# Patient Record
Sex: Female | Born: 1955 | Race: Black or African American | Hispanic: No | Marital: Married | Smoking: Never smoker
Health system: Southern US, Community
[De-identification: ages and names within clinical notes are randomized; demographics above are authoritative.]

## PROBLEM LIST (undated history)

## (undated) DIAGNOSIS — E119 Type 2 diabetes mellitus without complications: Secondary | ICD-10-CM

## (undated) DIAGNOSIS — K219 Gastro-esophageal reflux disease without esophagitis: Secondary | ICD-10-CM

## (undated) DIAGNOSIS — Z860100 Personal history of colon polyps, unspecified: Secondary | ICD-10-CM

## (undated) DIAGNOSIS — I10 Essential (primary) hypertension: Secondary | ICD-10-CM

## (undated) DIAGNOSIS — R011 Cardiac murmur, unspecified: Secondary | ICD-10-CM

## (undated) DIAGNOSIS — B351 Tinea unguium: Secondary | ICD-10-CM

## (undated) DIAGNOSIS — R079 Chest pain, unspecified: Secondary | ICD-10-CM

## (undated) HISTORY — DX: Tinea unguium: B35.1

## (undated) HISTORY — PX: ABDOMINAL HYSTERECTOMY: SHX81

## (undated) HISTORY — DX: Type 2 diabetes mellitus without complications: E11.9

## (undated) HISTORY — DX: Cardiac murmur, unspecified: R01.1

## (undated) HISTORY — PX: REDUCTION MAMMAPLASTY: SUR839

## (undated) HISTORY — DX: Gastro-esophageal reflux disease without esophagitis: K21.9

## (undated) HISTORY — DX: Personal history of colon polyps, unspecified: Z86.0100

## (undated) HISTORY — DX: Chest pain, unspecified: R07.9

## (undated) HISTORY — PX: OTHER SURGICAL HISTORY: SHX169

## (undated) HISTORY — PX: OOPHORECTOMY: SHX86

## (undated) HISTORY — DX: Personal history of colonic polyps: Z86.010

---

## 1998-12-11 ENCOUNTER — Other Ambulatory Visit: Admission: RE | Admit: 1998-12-11 | Discharge: 1998-12-11 | Payer: Self-pay

## 2004-03-23 ENCOUNTER — Other Ambulatory Visit: Payer: Self-pay

## 2004-03-27 ENCOUNTER — Inpatient Hospital Stay: Payer: Self-pay | Admitting: Unknown Physician Specialty

## 2004-11-19 ENCOUNTER — Ambulatory Visit: Payer: Self-pay | Admitting: Internal Medicine

## 2005-12-16 ENCOUNTER — Ambulatory Visit: Payer: Self-pay | Admitting: Gastroenterology

## 2006-02-13 ENCOUNTER — Ambulatory Visit: Payer: Self-pay | Admitting: Internal Medicine

## 2006-10-07 ENCOUNTER — Ambulatory Visit: Payer: Self-pay | Admitting: Chiropractor

## 2007-10-07 ENCOUNTER — Ambulatory Visit: Payer: Self-pay | Admitting: Internal Medicine

## 2008-10-10 ENCOUNTER — Ambulatory Visit: Payer: Self-pay | Admitting: Internal Medicine

## 2009-11-10 ENCOUNTER — Ambulatory Visit: Payer: Self-pay | Admitting: Internal Medicine

## 2010-12-04 ENCOUNTER — Ambulatory Visit: Payer: Self-pay | Admitting: Internal Medicine

## 2012-01-15 ENCOUNTER — Ambulatory Visit: Payer: Self-pay | Admitting: Internal Medicine

## 2013-06-17 ENCOUNTER — Ambulatory Visit: Payer: Self-pay | Admitting: Internal Medicine

## 2013-06-22 ENCOUNTER — Ambulatory Visit: Payer: Self-pay | Admitting: Internal Medicine

## 2013-12-21 ENCOUNTER — Ambulatory Visit: Payer: Self-pay | Admitting: Internal Medicine

## 2014-07-11 ENCOUNTER — Ambulatory Visit: Payer: Self-pay | Admitting: Internal Medicine

## 2015-05-16 ENCOUNTER — Other Ambulatory Visit: Payer: Self-pay | Admitting: Internal Medicine

## 2015-05-16 DIAGNOSIS — Z1231 Encounter for screening mammogram for malignant neoplasm of breast: Secondary | ICD-10-CM

## 2015-07-13 ENCOUNTER — Ambulatory Visit: Payer: Self-pay

## 2015-09-11 ENCOUNTER — Ambulatory Visit: Payer: Self-pay

## 2015-09-12 ENCOUNTER — Ambulatory Visit
Admission: RE | Admit: 2015-09-12 | Discharge: 2015-09-12 | Disposition: A | Discharge status: Home / Self Care | Payer: Managed Care, Other (non HMO) | Source: Ambulatory Visit | Attending: Internal Medicine | Admitting: Internal Medicine

## 2015-09-12 DIAGNOSIS — Z1231 Encounter for screening mammogram for malignant neoplasm of breast: Secondary | ICD-10-CM | POA: Insufficient documentation

## 2016-07-03 ENCOUNTER — Other Ambulatory Visit: Payer: Self-pay | Admitting: Internal Medicine

## 2016-07-03 DIAGNOSIS — Z1231 Encounter for screening mammogram for malignant neoplasm of breast: Secondary | ICD-10-CM

## 2016-09-16 ENCOUNTER — Ambulatory Visit: Payer: Managed Care, Other (non HMO)

## 2016-09-25 ENCOUNTER — Ambulatory Visit
Admission: RE | Admit: 2016-09-25 | Discharge: 2016-09-25 | Disposition: A | Discharge status: Home / Self Care | Payer: Managed Care, Other (non HMO) | Source: Ambulatory Visit | Attending: Internal Medicine | Admitting: Internal Medicine

## 2016-09-25 ENCOUNTER — Other Ambulatory Visit: Payer: Self-pay | Admitting: Internal Medicine

## 2016-09-25 DIAGNOSIS — Z1231 Encounter for screening mammogram for malignant neoplasm of breast: Secondary | ICD-10-CM

## 2016-12-06 ENCOUNTER — Other Ambulatory Visit: Payer: Self-pay | Admitting: Internal Medicine

## 2016-12-06 DIAGNOSIS — M79605 Pain in left leg: Principal | ICD-10-CM

## 2016-12-06 DIAGNOSIS — M79604 Pain in right leg: Secondary | ICD-10-CM

## 2016-12-11 ENCOUNTER — Ambulatory Visit
Admission: RE | Admit: 2016-12-11 | Discharge: 2016-12-11 | Disposition: A | Discharge status: Home / Self Care | Payer: Managed Care, Other (non HMO) | Source: Ambulatory Visit | Attending: Internal Medicine | Admitting: Internal Medicine

## 2016-12-11 DIAGNOSIS — M79604 Pain in right leg: Secondary | ICD-10-CM

## 2016-12-11 DIAGNOSIS — M79606 Pain in leg, unspecified: Secondary | ICD-10-CM | POA: Insufficient documentation

## 2016-12-11 DIAGNOSIS — M79605 Pain in left leg: Secondary | ICD-10-CM

## 2016-12-11 DIAGNOSIS — E1151 Type 2 diabetes mellitus with diabetic peripheral angiopathy without gangrene: Secondary | ICD-10-CM | POA: Diagnosis not present

## 2017-07-13 ENCOUNTER — Encounter: Payer: Self-pay | Admitting: Emergency Medicine

## 2017-07-13 ENCOUNTER — Emergency Department
Admission: EM | Admit: 2017-07-13 | Discharge: 2017-07-13 | Disposition: A | Discharge status: Home / Self Care | Payer: Managed Care, Other (non HMO) | Attending: Emergency Medicine | Admitting: Emergency Medicine

## 2017-07-13 ENCOUNTER — Emergency Department: Payer: Managed Care, Other (non HMO)

## 2017-07-13 DIAGNOSIS — Z7982 Long term (current) use of aspirin: Secondary | ICD-10-CM | POA: Diagnosis not present

## 2017-07-13 DIAGNOSIS — I10 Essential (primary) hypertension: Secondary | ICD-10-CM | POA: Diagnosis not present

## 2017-07-13 DIAGNOSIS — M549 Dorsalgia, unspecified: Secondary | ICD-10-CM

## 2017-07-13 DIAGNOSIS — M545 Low back pain, unspecified: Secondary | ICD-10-CM

## 2017-07-13 HISTORY — DX: Essential (primary) hypertension: I10

## 2017-07-13 MED ORDER — KETOROLAC TROMETHAMINE 30 MG/ML IJ SOLN
30.0000 mg | Freq: Once | INTRAMUSCULAR | Status: AC
  Administered 2017-07-13: 30 mg via INTRAMUSCULAR
  Filled 2017-07-13: qty 1

## 2017-07-13 MED ORDER — NAPROXEN 500 MG PO TABS: 500.0000 mg | ORAL_TABLET | Freq: Two times a day (BID) | ORAL | 0 refills | Status: DC

## 2017-07-13 MED ORDER — CYCLOBENZAPRINE HCL 10 MG PO TABS: 10.0000 mg | ORAL_TABLET | Freq: Three times a day (TID) | ORAL | 0 refills | Status: DC | PRN

## 2017-07-13 MED ORDER — CYCLOBENZAPRINE HCL 10 MG PO TABS
10.0000 mg | ORAL_TABLET | Freq: Once | ORAL | Status: AC
  Administered 2017-07-13: 10 mg via ORAL
  Filled 2017-07-13: qty 1

## 2017-07-13 NOTE — ED Notes (Signed)
See triage note  Presents with mid to lower back pain which started on Friday  Denies any known injury  Pain is mainly on the right but is also having some numbness to left leg

## 2017-07-13 NOTE — ED Triage Notes (Signed)
Pt c/o back pain to mid back. Pt reports pain is mostly right side but is on the left side as well. Pt reports was seen by her chiropractor on Friday due to the pain but it did not help. Pt denies activities or recently injuries that would have caused the pain. Pt denies urinary symptoms.

## 2017-07-13 NOTE — ED Provider Notes (Signed)
[facility] Emergency Department Provider Note ____________________________________________  Time seen: Approximately 10:31 AM  I have reviewed the triage vital signs and the nursing notes.   HISTORY  Chief Complaint Back Pain    HPI Elspeth Chovelyn D Waage is a 62 y.o. female who presents to the emergency department for evaluation and treatment of back pain that started on Friday. She noticed it upon awakening. No relief with NSAIDs or a chiropractor adjustment, which typically helps when she has back pain. Pain worsens when lifting the left leg to her pants or with rotation at the waist on the right side. She states that outside of mopping on Thursday she has not done any strenuous activity. She did say that while mopping, the mop broke and she may have twisted but didn't realize it.  Past Medical History:  Diagnosis Date  . Hypertension     There are no active problems to display for this patient.   Past Surgical History:  Procedure Laterality Date  . REDUCTION MAMMAPLASTY Bilateral     Prior to Admission medications   Medication Sig Start Date End Date Taking? Authorizing Provider  amLODipine (NORVASC) 10 MG tablet Take 10 mg by mouth daily.   Yes [provider]  aspirin 81 MG chewable tablet Chew 81 mg by mouth daily.   Yes [provider]  levothyroxine (SYNTHROID, LEVOTHROID) 50 MCG tablet Take 50 mcg by mouth daily before breakfast.   Yes [provider]  lisinopril-hydrochlorothiazide (PRINZIDE,ZESTORETIC) 20-25 MG tablet Take 1 tablet by mouth daily.   Yes [provider]  metFORMIN (GLUCOPHAGE) 500 MG tablet Take 500 mg by mouth 2 (two) times daily with a meal.   Yes [provider]  cyclobenzaprine (FLEXERIL) 10 MG tablet Take 1 tablet (10 mg total) by mouth 3 (three) times daily as needed for muscle spasms. 07/13/17   Kayln Garceau, Rulon Eisenmengerari B, FNP  naproxen (NAPROSYN) 500 MG tablet Take 1 tablet (500 mg total)  by mouth 2 (two) times daily with a meal. 07/13/17   Evelise Reine B, FNP    Allergies Patient has no known allergies.  No family history on file.  Social History Social History   Tobacco Use  . Smoking status: Not on file  Substance Use Topics  . Alcohol use: No    Frequency: Never  . Drug use: No    Review of Systems Constitutional: Negative for recent illness. Cardiovascular: Negative for chest pain Respiratory: Negative for shortness of breath Musculoskeletal: Positive for mid and lower back pain Skin: Negative for rash, lesion, or wound. ____________________________________________   PHYSICAL EXAM:  VITAL SIGNS: ED Triage Vitals  Enc Vitals Group     BP 07/13/17 0805 (!) 165/77     Pulse Rate 07/13/17 0805 68     Resp 07/13/17 0805 20     Temp 07/13/17 0805 98.2 F (36.8 C)     Temp Source 07/13/17 0805 Oral     SpO2 07/13/17 0805 97 %     Weight 07/13/17 0806 178 lb (80.7 kg)     Height 07/13/17 0806 5\' 6"  (1.676 m)     Head Circumference --      Peak Flow --      Pain Score 07/13/17 0806 7     Pain Loc --      Pain Edu? --      Excl. in GC? --     Constitutional: Alert and oriented. Well appearing and in no acute distress. Eyes: Conjunctivae are clear  without discharge or drainage Head: Atraumatic Neck: Supple Respiratory: Respirations even and unlabored. Musculoskeletal: Pain in the mid and lower back is induced with movement.  Straight leg raise is negative bilaterally.  Pain of the mid and lower back is diffuse but greater on the right side.  Pain in the left lower back is induced with flexion of the left hip. Neurologic: No radiculopathy Skin: Intact Psychiatric: Affect and behavior is appropriate  ____________________________________________   LABS (all labs ordered are listed, but only abnormal results are displayed)  Labs Reviewed - No data to display ____________________________________________  RADIOLOGY  Images of the thoracic and  lumbar spine indicated no acute bony abnormality per radiology.  There is some degenerative disc changes on both the thoracic and lumbar spine. ____________________________________________   PROCEDURES  Procedures  ____________________________________________   INITIAL IMPRESSION / ASSESSMENT AND PLAN / ED COURSE  EVERLEAN BUCHER is a 62 y.o. female who presents to the emergency department for treatment and evaluation of mid and lower back pain that was not relieved by over-the-counter medications.  The patient states that she had taken ibuprofen, however reports that the tablet was "325 mg."  This is most likely acetaminophen or aspirin and not ibuprofen.  She will be given an injection of Toradol and a Flexeril while here in the emergency department.  She will be given a prescription of Naprosyn and Flexeril upon discharge.  She was instructed to follow-up with her primary care provider if not improving over a week.  She was advised to return to the emergency department for symptoms of change or worsen if she is unable to schedule an appointment.  Medications  ketorolac (TORADOL) 30 MG/ML injection 30 mg (30 mg Intramuscular Given 07/13/17 1057)  cyclobenzaprine (FLEXERIL) tablet 10 mg (10 mg Oral Given 07/13/17 1057)    Pertinent labs & imaging results that were available during my care of the patient were reviewed by me and considered in my medical decision making (see chart for details).  _________________________________________   FINAL CLINICAL IMPRESSION(S) / ED DIAGNOSES  Final diagnoses:  Acute lumbar back pain  Musculoskeletal back pain    ED Discharge Orders        Ordered    cyclobenzaprine (FLEXERIL) 10 MG tablet  3 times daily PRN     07/13/17 1111    naproxen (NAPROSYN) 500 MG tablet  2 times daily with meals     07/13/17 1111       If controlled substance prescribed during this visit, 12 month history viewed on the [facility] prior to issuing an initial  prescription for Schedule II or III opiod.    Chinita Pester, FNP 07/13/17 1334    Governor Rooks, MD 07/13/17 [phone]

## 2017-07-25 ENCOUNTER — Other Ambulatory Visit: Payer: Self-pay | Admitting: Internal Medicine

## 2017-07-25 DIAGNOSIS — Z1231 Encounter for screening mammogram for malignant neoplasm of breast: Secondary | ICD-10-CM

## 2017-08-16 ENCOUNTER — Encounter: Payer: Self-pay | Admitting: Emergency Medicine

## 2017-08-16 ENCOUNTER — Other Ambulatory Visit: Payer: Self-pay

## 2017-08-16 DIAGNOSIS — G8929 Other chronic pain: Secondary | ICD-10-CM | POA: Insufficient documentation

## 2017-08-16 DIAGNOSIS — Z79899 Other long term (current) drug therapy: Secondary | ICD-10-CM | POA: Diagnosis not present

## 2017-08-16 DIAGNOSIS — M545 Low back pain: Secondary | ICD-10-CM | POA: Diagnosis not present

## 2017-08-16 DIAGNOSIS — I10 Essential (primary) hypertension: Secondary | ICD-10-CM | POA: Insufficient documentation

## 2017-08-16 NOTE — ED Triage Notes (Signed)
Pt arrives ambulatory to triage with c/o right lower back pain which she states that was occurring all day and was increased after a long car ride. Pt states that she has had issues with this in the past. Pt is in NAD.

## 2017-08-17 ENCOUNTER — Emergency Department
Admission: EM | Admit: 2017-08-17 | Discharge: 2017-08-17 | Disposition: A | Discharge status: Home / Self Care | Payer: Managed Care, Other (non HMO) | Attending: Emergency Medicine | Admitting: Emergency Medicine

## 2017-08-17 DIAGNOSIS — G8929 Other chronic pain: Secondary | ICD-10-CM

## 2017-08-17 DIAGNOSIS — M545 Low back pain: Secondary | ICD-10-CM

## 2017-08-17 MED ORDER — HYDROCODONE-ACETAMINOPHEN 5-325 MG PO TABS: 1.0000 | ORAL_TABLET | Freq: Four times a day (QID) | ORAL | 0 refills | Status: DC | PRN

## 2017-08-17 MED ORDER — IBUPROFEN 600 MG PO TABS
600.0000 mg | ORAL_TABLET | Freq: Once | ORAL | Status: AC
  Administered 2017-08-17: 600 mg via ORAL
  Filled 2017-08-17: qty 1

## 2017-08-17 MED ORDER — PREDNISONE 50 MG PO TABS: 50.0000 mg | ORAL_TABLET | Freq: Every day | ORAL | 0 refills | Status: AC

## 2017-08-17 MED ORDER — PREDNISONE 20 MG PO TABS
60.0000 mg | ORAL_TABLET | Freq: Once | ORAL | Status: AC
  Administered 2017-08-17: 60 mg via ORAL
  Filled 2017-08-17: qty 3

## 2017-08-17 MED ORDER — HYDROCODONE-ACETAMINOPHEN 5-325 MG PO TABS
1.0000 | ORAL_TABLET | Freq: Once | ORAL | Status: AC
  Administered 2017-08-17: 1 via ORAL
  Filled 2017-08-17: qty 1

## 2017-08-17 NOTE — ED Notes (Signed)
Pt reports she rode in a car today for about 2 hours. Developed right side mid to lower back pain after that. Reports since being her the pain now radiates to her left lower back. Pt took a muscle relaxer at home earlier this evening with no relief. Pt describes the pain as tingling and rates at 5/10 while still and 9/10 with movement. CMS intact. Denies injury.

## 2017-08-17 NOTE — ED Provider Notes (Signed)
[facility] Emergency Department Provider Note  ____________________________________________   First MD Initiated Contact with Patient 08/17/17 [phone]     (approximate)  I have reviewed the triage vital signs and the nursing notes.   HISTORY  Chief Complaint Back Pain   HPI Morgan Ochoa is a 62 y.o. female who comes to the emergency department with gradual onset not maximal onset moderate severity throbbing right low back pain radiating down her right buttock towards the lateral aspect of her right leg.  She has a long-standing history of chronic low back pain.  She took a 2-hour car ride today which she feels exacerbated her symptoms.  Her symptoms are slightly worse when walking and improved with rest.  She denies trauma.  She denies fevers or chills.  She denies history of TB and IV drug use or steroids.  She denies fecal or urinary hesitance or incontinence.  Past Medical History:  Diagnosis Date  . Hypertension     There are no active problems to display for this patient.   Past Surgical History:  Procedure Laterality Date  . REDUCTION MAMMAPLASTY Bilateral     Prior to Admission medications   Medication Sig Start Date End Date Taking? Authorizing Provider  amLODipine (NORVASC) 10 MG tablet Take 10 mg by mouth daily.    [provider]  aspirin 81 MG chewable tablet Chew 81 mg by mouth daily.    [provider]  cyclobenzaprine (FLEXERIL) 10 MG tablet Take 1 tablet (10 mg total) by mouth 3 (three) times daily as needed for muscle spasms. 07/13/17   Triplett, Rulon Eisenmengerari B, FNP  HYDROcodone-acetaminophen (NORCO) 5-325 MG tablet Take 1 tablet by mouth every 6 (six) hours as needed for up to 7 doses for severe pain. 08/17/17   Merrily Brittleifenbark, Caliph [place]ow[place]ak, MD  levothyroxine (SYNTHROID, LEVOTHROID) 50 MCG tablet Take 50 mcg by mouth daily before breakfast.    [provider]  lisinopril-hydrochlorothiazide (PRINZIDE,ZESTORETIC) 20-25 MG tablet  Take 1 tablet by mouth daily.    [provider]  metFORMIN (GLUCOPHAGE) 500 MG tablet Take 500 mg by mouth 2 (two) times daily with a meal.    [provider]  naproxen (NAPROSYN) 500 MG tablet Take 1 tablet (500 mg total) by mouth 2 (two) times daily with a meal. 07/13/17   Triplett, Cari B, FNP  predniSONE (DELTASONE) 50 MG tablet Take 1 tablet (50 mg total) by mouth daily for 4 days. 08/17/17 08/21/17  Merrily Brittleifenbark, Jaydon Soroka, MD    Allergies Patient has no known allergies.  No family history on file.  Social History Social History   Tobacco Use  . Smoking status: Never Smoker  . Smokeless tobacco: Never Used  Substance Use Topics  . Alcohol use: No    Frequency: Never  . Drug use: No    Review of Systems Constitutional: No fever/chills ENT: No sore throat. Cardiovascular: Denies chest pain. Respiratory: Denies shortness of breath. Gastrointestinal: No abdominal pain.  No nausea, no vomiting.  No diarrhea.  No constipation. Musculoskeletal: Positive for back pain. Neurological: Negative for headaches   ____________________________________________   PHYSICAL EXAM:  VITAL SIGNS: ED Triage Vitals [08/16/17 2218]  Enc Vitals Group     BP (!) 167/78     Pulse Rate 71     Resp 18     Temp 98.7 F (37.1 C)     Temp Source Oral     SpO2 100 %     Weight 175 lb (79.4 kg)  Height 5\' 7"  (1.702 m)     Head Circumference      Peak Flow      Pain Score 8     Pain Loc      Pain Edu?      Excl. in GC?     Constitutional: Alert and oriented x4 appears somewhat uncomfortable nontoxic no diaphoresis speaks full clear sentences Head: Atraumatic. Nose: No congestion/rhinnorhea. Mouth/Throat: No trismus Neck: No stridor.   Cardiovascular: Regular rate and rhythm Respiratory: Normal respiratory effort.  No retractions. MSK: No midline tenderness she is tender right paraspinal spasm Neurologic:  Normal speech and language. No gross focal neurologic deficits are  appreciated.  Skin:  Skin is warm, dry and intact. No rash noted.    ____________________________________________  LABS (all labs ordered are listed, but only abnormal results are displayed)  Labs Reviewed - No data to display   __________________________________________  EKG   ____________________________________________  RADIOLOGY   ____________________________________________   DIFFERENTIAL includes but not limited to  Musculoskeletal low back pain, cauda equina, epidural abscess   PROCEDURES  Procedure(s) performed: no  Procedures  Critical Care performed: no  Observation: no ____________________________________________   INITIAL IMPRESSION / ASSESSMENT AND PLAN / ED COURSE  Pertinent labs & imaging results that were available during my care of the patient were reviewed by me and considered in my medical decision making (see chart for details).      ----------------------------------------- 3:44 AM on 08/17/2017 -----------------------------------------  The patient arrives stiff and uncomfortable appearing although neuro intact.  She has no new trauma.  She had recent imaging of her back.  She says the only thing tends to work for her back pain is prednisone.  Given a dose of prednisone and Norco now and her pain is improved.  She will be discharged home with primary care follow-up and strict return precautions.  She verbalizes understanding and agreement the plan. ____________________________________________   FINAL CLINICAL IMPRESSION(S) / ED DIAGNOSES  Final diagnoses:  Chronic right-sided low back pain without sciatica      NEW MEDICATIONS STARTED DURING THIS VISIT:  Discharge Medication List as of 08/17/2017  3:44 AM    START taking these medications   Details  HYDROcodone-acetaminophen (NORCO) 5-325 MG tablet Take 1 tablet by mouth every 6 (six) hours as needed for up to 7 doses for severe pain., Starting Sun 08/17/2017, Print      predniSONE (DELTASONE) 50 MG tablet Take 1 tablet (50 mg total) by mouth daily for 4 days., Starting Sun 08/17/2017, Until Thu 08/21/2017, Print         Note:  This document was prepared using Dragon voice recognition software and may include unintentional dictation errors.      Merrily Brittle, MD 08/20/17 [phone]

## 2017-08-17 NOTE — Discharge Instructions (Signed)
Please take your pain medication as needed for severe symptoms and follow-up with your primary care physician for reevaluation.  Return to the emergency department sooner for any new or worsening symptoms such as fevers, chills, worsening pain, or for any other issues whatsoever.  It was a pleasure to take care of you today, and thank you for coming to our emergency department.  If you have any questions or concerns before leaving please ask the nurse to grab me and I'm more than happy to go through your aftercare instructions again.  If you were prescribed any opioid pain medication today such as Norco, Vicodin, Percocet, morphine, hydrocodone, or oxycodone please make sure you do not drive when you are taking this medication as it can alter your ability to drive safely.  If you have any concerns once you are home that you are not improving or are in fact getting worse before you can make it to your follow-up appointment, please do not hesitate to call [phone] and come back for further evaluation.  Merrily BrittleNeil Nadezhda Pollitt, MD

## 2017-08-26 ENCOUNTER — Ambulatory Visit: Payer: Managed Care, Other (non HMO) | Attending: Internal Medicine

## 2017-08-26 ENCOUNTER — Other Ambulatory Visit: Payer: Self-pay

## 2017-08-26 DIAGNOSIS — M546 Pain in thoracic spine: Secondary | ICD-10-CM | POA: Diagnosis present

## 2017-08-26 DIAGNOSIS — M545 Low back pain: Secondary | ICD-10-CM | POA: Insufficient documentation

## 2017-08-26 NOTE — Patient Instructions (Signed)
(  Home) Extension: Thoracic With Lumbar Lock - Sitting    Sit with back against chair, knees bent, hands folded across (not shown). Extend trunk over chair back. Hold position for __5__ seconds. Repeat ___10  times per set. Do _3___ sets per session daily.   Copyright  VHI. All rights reserved.       Sitting on a chair    Press your hands on your thighs to feel your abdominal muscles contract.   Hold for 5 seconds comfortably.   Repeat 10 times.   Perform 3 sets daily.

## 2017-08-26 NOTE — Therapy (Signed)
[facility] [address] [place], [place], [address] Phone: [phone]   Fax:  [phone]  Physical Therapy Evaluation  Patient Details  Name: Morgan Ochoa MRN: [id number] Date of Birth: 09/12/1955 Referring Provider: Yves DillNeelam Khan, MD   Encounter Date: 08/26/2017  PT End of Session - 08/26/17 1314    Visit Number  1    Number of Visits  11    Date for PT Re-Evaluation  10/02/17    PT Start Time  1315    PT Stop Time  1422    PT Time Calculation (min)  67 min    Activity Tolerance  Patient tolerated treatment well    Behavior During Therapy  [employer] for tasks assessed/performed       Past Medical History:  Diagnosis Date  . Cardiac murmur    from pt medical record  . Chest pain    from pt medical record  . DM2 (diabetes mellitus, type 2) (HCC)    from pt medical record  . GERD (gastroesophageal reflux disease)    from pt medical record  . History of colon polyps    from pt medical record  . Hypertension   . Onychomycosis    from pt medical record    Past Surgical History:  Procedure Laterality Date  . cholonoscopy  2007, 2015   from pt medical record  . REDUCTION MAMMAPLASTY Bilateral     There were no vitals filed for this visit.   Subjective Assessment - 08/26/17 1321    Subjective  R thoracic and low back: 0/10 currently (pt sitting with her R leg crossed over her L), 10/10 at most for the past 2 weeks. Does not know what brought her pain to 10/10.     Pertinent History  Back pain. Gradual onset about 4 weeks ago. 4 weeks ago, pain began on her R side from her shoulder blade to her low back. Also felt pain in her L shoulder, L lateral neck and lateral arm last weekend.  Denies loss of bowel or bladder control, or saddle anesthesia. Feels tingling L lateral thigh. Back pain arrived, was very very intense, lasted for 5 days, then gradually eased off, then she currently has no back pain.   Pt states  that her MD sent her to PT to strengthen her back. L arm is also getting better. 4 weeks ago, pt also went to a chiropractic adjustment and pt feels like the treatment might have aggravated her pain. Heat did not help. Back started feeling a slight ache around May 2018 when pt caught her husband forward, then pulled him back and laid him to the floor when he passed out. Back bothered her to the point where she had pain going up and down steps (anterior thigh and neck pain until October 2018).       Patient Stated Goals  Pt expresses desire to get better.    Currently in Pain?  No/denies    Pain Score  0-No pain    Pain Location  Back    Pain Orientation  Right    Pain Descriptors / Indicators  Aching difficult to tell per pt.     Pain Type  Acute pain    Pain Onset  1 to 4 weeks ago    Pain Frequency  Occasional    Aggravating Factors   Does not know what bothers her back. Back pain just happens. Sometimes she just walks into her  house and her ache increases. L shoulder: reaching over, raising her L arm up, supination.     Pain Relieving Factors  muscle relaxers (not currently on muscle relaxers).          [facility] PT Assessment - 08/26/17 1331      Assessment   Medical Diagnosis  Back pain    Referring Provider  Yves Dill, MD    Onset Date/Surgical Date  08/21/17 Date PT referral signed. Back ache started May 2018    Prior Therapy  No known PT for current condition.       Precautions   Precaution Comments  No known precautions      Restrictions   Other Position/Activity Restrictions  No kown weight bearing restrictions      Observation/Other Assessments   Observations  (-) repeated flexion test (slight R trunk lateral lean when performing movement), (-) Long sit test.       Posture/Postural Control   Posture Comments  Protracted neck, bilaterally protracted shoulders, R shoulder lower, thoracic flexion, slight R trunk rotation. R lateral lean. movement crease around L3/4 area.  Convex R lower thoracic spine.       AROM   Lumbar Flexion  Full, no pain slight R lateral lean with trunk flexion    Lumbar Extension  WFL, no pain    Lumbar - Right Side [facility] with R low back dicomfort (similar area to 4 weeks ago)     Lumbar - Left Side [facility] with L low back pain    Lumbar - Right Rotation  WFL with slight R thoracolumbar symptoms  Sitting    Lumbar - Left Rotation  WFL with slight L lateral trunk symptoms.  Sitting      Strength   Right Hip Flexion  4+/5    Right Hip Extension  4-/5    Right Hip ABduction  4/5    Left Hip Flexion  4+/5    Left Hip Extension  4-/5    Left Hip ABduction  4+/5    Right Knee Flexion  4/5    Right Knee Extension  5/5    Left Knee Flexion  4+/5    Left Knee Extension  5/5      Palpation   Palpation comment  Increased R thoracolumbar paraspinal muscle tension with slight tenderness (same area as her pain 4 weeks ago).  No TTP with R and L UPA to lumbar and lower thoracic transverse process. Decreased Centeral P to A to lumbar spine. Increased stiffness with central P to A throughout thoracic spine.       Ambulation/Gait   Gait Comments  Decrease trunk rotation, L pelvic drop during R LE stance phase > R pelvic drop during L LE stance phase.                Yves Dill, MD 08/21/17   Objective measurements completed on examination: See above findings.   Blood pressure controlled per pt.    4 weeks ago, pt also went to a chiropractic adjustment and pt feels like the treatment might have aggravated her pain. Heat did not help.   Back started feeling a slight ache around May 2018 when pt caught her husband forward, then pulled him back and laid him to the floor when he passed out. Back bothered her to the point where she had pain going up and down steps (anterior thigh and neck pain until October 2018).   Therapeutic exercise  Standing low  rows resisting yellow band 10x5 seconds (easy)   Supine bilateral shoulder  flexion to promote thoracic extension 10x5 seconds   Then with thoracic towel roll 3x5 seconds   Seated thoracic extension over chair 10x5 seconds  Seated bilateral shoulder extension isometrics, hands on thighs 10x5 seconds.   Reviewed HEP. Pt demonstrated and verbalized understanding.   Improved exercise technique, movement at target joints, use of target muscles after mod verbal, visual, tactile cues.     Standing gentle trunk lean when getting purse from mat table.  reproduced R lateral posterior trunk symptoms per pt.     Patient is a 62 year old female who came to physical therapy secondary to R thoracic and low back pain. She also presents with altered posture, decreased lumbar and thoracic mobility, slight reproduction of symptoms with lumbar side bending and rotation, glute weakness R > L, and increased R paraspinal muscle tension at the lumbar and thoracic area. Patient will benefit from skilled physical therapy services to address the aforementioned deficits.           PT Education - 08/26/17 1907    Education provided  Yes    Education Details  ther-ex, HEP, plan of care    Person(s) Educated  Patient    Methods  Explanation;Demonstration;Tactile cues;Verbal cues;Handout    Comprehension  Returned demonstration;Verbalized understanding          PT Long Term Goals - 08/26/17 1835      PT LONG TERM GOAL #1   Title  Patient will have a decrease in back pain to 4/10 or less at worst to promote ability to perform functional tasks.     Baseline  10/10 back pain at most for the past 2 weeks (08/26/2017)    Time  5    Period  Weeks    Status  New    Target Date  10/02/17      PT LONG TERM GOAL #2   Title  Patient will improve bilateral glute med and max strength by at least 1/2 MMT grade to promote ability to perform functional tasks with minimal to no back pain.     Time  5    Period  Weeks    Status  New    Target Date  10/02/17             Plan -  08/26/17 1829    Clinical Impression Statement  Patient is a 62 year old female who came to physical therapy secondary to R thoracic and low back pain. She also presents with altered posture, decreased lumbar and thoracic mobility, slight reproduction of symptoms with lumbar side bending and rotation, glute weakness R > L, and increased R paraspinal muscle tension at the lumbar and thoracic area. Patient will benefit from skilled physical therapy services to address the aforementioned deficits.     History and Personal Factors relevant to plan of care:  Chronicity of condition, thoracic stiffness    Clinical Presentation  Stable    Clinical Presentation due to:  pain has improved based on pt subjective reports    Clinical Decision Making  Low    Rehab Potential  Good    Clinical Impairments Affecting Rehab Potential  Chronicity of condition, thoracic stiffness    PT Frequency  2x / week    PT Duration  Other (comment) 5 weeks    PT Treatment/Interventions  Therapeutic exercise;Therapeutic activities;Manual techniques;Aquatic Therapy;Electrical Stimulation;Iontophoresis 4mg /ml Dexamethasone;Traction;Neuromuscular re-education;Patient/family education;Dry needling traction if appropriate  PT Next Visit Plan  core strengthening, thoracic mobility, hip and scapular strengthening, manual techniques, modalities PRN    Consulted and Agree with Plan of Care  Patient       Patient will benefit from skilled therapeutic intervention in order to improve the following deficits and impairments:  Postural dysfunction, Pain, Decreased strength, Hypomobility  Visit Diagnosis: Right low back pain, unspecified chronicity, with sciatica presence unspecified - Plan: PT plan of care cert/re-cert  Pain in thoracic spine - Plan: PT plan of care cert/re-cert     Problem List There are no active problems to display for this patient.   Loralyn Freshwater PT, DPT   08/26/2017, 7:10 PM  [place] [facility]  [facility] PHYSICAL AND SPORTS MEDICINE [address] [place], [address] Phone: [phone]   Fax:  [phone]  Name: AVALINA BENKO MRN: [id number] Date of Birth: 02/16/1956

## 2017-09-01 ENCOUNTER — Ambulatory Visit: Payer: Managed Care, Other (non HMO)

## 2017-09-01 DIAGNOSIS — M545 Low back pain: Secondary | ICD-10-CM

## 2017-09-01 DIAGNOSIS — M546 Pain in thoracic spine: Secondary | ICD-10-CM

## 2017-09-01 NOTE — Therapy (Signed)
[facility]INE [address], [place], [address] Phone: [phone]   Fax:  [phone]  Physical Therapy Treatment  Patient Details  Name: Morgan Ochoa MRN: [id number] Date of Birth: 03/07/1956 Referring Provider: Yves Dill, MD   Encounter Date: 09/01/2017  PT End of Session - 09/01/17 1433    Visit Number  2    Number of Visits  11    Date for PT Re-Evaluation  10/02/17    PT Start Time  1434    PT Stop Time  1514    PT Time Calculation (min)  40 min    Activity Tolerance  Patient tolerated treatment well    Behavior During Therapy  [facility] for tasks assessed/performed       Past Medical History:  Diagnosis Date  . Cardiac murmur    from pt medical record  . Chest pain    from pt medical record  . DM2 (diabetes mellitus, type 2) (HCC)    from pt medical record  . GERD (gastroesophageal reflux disease)    from pt medical record  . History of colon polyps    from pt medical record  . Hypertension   . Onychomycosis    from pt medical record    Past Surgical History:  Procedure Laterality Date  . cholonoscopy  2007, 2015   from pt medical record  . REDUCTION MAMMAPLASTY Bilateral     There were no vitals filed for this visit.  Subjective Assessment - 09/01/17 1434    Subjective  Back is fine. No pain. The HEP is doing ok. Has been doing them.     Pertinent History  Back pain. Gradual onset about 4 weeks ago. 4 weeks ago, pain began on her R side from her shoulder blade to her low back. Also felt pain in her L shoulder, L lateral neck and lateral arm last weekend.  Denies loss of bowel or bladder control, or saddle anesthesia. Feels tingling L lateral thigh. Back pain arrived, was very very intense, lasted for 5 days, then gradually eased off, then she currently has no back pain.   Pt states that her MD sent her to PT to strengthen her back. L arm is also getting better. 4 weeks ago, pt also went  to a chiropractic adjustment and pt feels like the treatment might have aggravated her pain. Heat did not help. Back started feeling a slight ache around May 2018 when pt caught her husband forward, then pulled him back and laid him to the floor when he passed out. Back bothered her to the point where she had pain going up and down steps (anterior thigh and neck pain until October 2018).       Patient Stated Goals  Pt expresses desire to get better.    Currently in Pain?  No/denies    Pain Score  0-No pain    Pain Onset  1 to 4 weeks ago                               PT Education - 09/01/17 1438    Education provided  Yes    Education Details  ther-ex    [employer]) Educated  Patient    Methods  Explanation;Demonstration;Tactile cues;Verbal cues    Comprehension  Returned demonstration;Verbalized understanding          Objetives   No latex band allergies.  Therapeutic exercise  Standing low rows resisting red band 10x5 seconds  Then with green band 10x5 seconds  Omega rows plate 20 for 16X 5 seconds   paloff press red band 10x 5 seconds each side for 2 sets to promote trunk muscle strengthening.    standing hip extension resisting yellow band with bilateral UE assist 10x each LE to promote glute strengthening  Then red band 10x2 each LE  Standing LE leg press resisting blue band with bilateral UE assist 10x3 each LE  Seated with upright posture: manual perturbation from PT 1 minute x 3 to promote trunk strengthening  Side stepping resisting yellow band around ankles 32 ft to the R and 32 ft to the LE to promote glute med muscle strengthenig.   Standing straight pallof press resisting green band  10x5 seconds  for 2 sets    Improved exercise technique, movement at target joints, use of target muscles after mod verbal, visual, tactile cues.   Decreased R lumbar paraspinal muscle tension felt with exercises promoting activation of trunk  muscles such as low rows. Continued working on trunk and hip strengthening to promote ability to perform standing tasks without back pain. Pt tolerated session well without aggravation of symptoms.         PT Long Term Goals - 08/26/17 1835      PT LONG TERM GOAL #1   Title  Patient will have a decrease in back pain to 4/10 or less at worst to promote ability to perform functional tasks.     Baseline  10/10 back pain at most for the past 2 weeks (08/26/2017)    Time  5    Period  Weeks    Status  New    Target Date  10/02/17      PT LONG TERM GOAL #2   Title  Patient will improve bilateral glute med and max strength by at least 1/2 MMT grade to promote ability to perform functional tasks with minimal to no back pain.     Time  5    Period  Weeks    Status  New    Target Date  10/02/17            Plan - 09/01/17 1439    Clinical Impression Statement  Decreased R lumbar paraspinal muscle tension felt with exercises promoting activation of trunk muscles such as low rows. Continued working on trunk and hip strengthening to promote ability to perform standing tasks without back pain. Pt tolerated session well without aggravation of symptoms.     Rehab Potential  Good    Clinical Impairments Affecting Rehab Potential  Chronicity of condition, thoracic stiffness    PT Frequency  2x / week    PT Duration  Other (comment) 5 weeks    PT Treatment/Interventions  Therapeutic exercise;Therapeutic activities;Manual techniques;Aquatic Therapy;Electrical Stimulation;Iontophoresis 4mg /ml Dexamethasone;Traction;Neuromuscular re-education;Patient/family education;Dry needling traction if appropriate    PT Next Visit Plan  core strengthening, thoracic mobility, hip and scapular strengthening, manual techniques, modalities PRN    Consulted and Agree with Plan of Care  Patient       Patient will benefit from skilled therapeutic intervention in order to improve the following deficits and  impairments:  Postural dysfunction, Pain, Decreased strength, Hypomobility  Visit Diagnosis: Pain in thoracic spine  Right low back pain, unspecified chronicity, with sciatica presence unspecified     Problem List There are no active problems to display for this patient.   Loralyn Freshwater PT, DPT  09/01/2017, 6:05 PM  [facility] [address] [place], [place], [address] Phone: [phone]   Fax:  [phone]  Name: Morgan Ochoa MRN: [id number] Date of Birth: 07/25/1955

## 2017-09-03 ENCOUNTER — Ambulatory Visit: Payer: Managed Care, Other (non HMO)

## 2017-09-03 DIAGNOSIS — M545 Low back pain: Secondary | ICD-10-CM | POA: Diagnosis not present

## 2017-09-03 DIAGNOSIS — M546 Pain in thoracic spine: Secondary | ICD-10-CM

## 2017-09-03 NOTE — Therapy (Signed)
[place] [facility] PH[facility]ORTS MEDICINE [address], [place], [address] Phone: [phone]   Fax:  [phone]  Physical Therapy Treatment  Patient Details  Name: Morgan Ochoa MRN: [id number] Date of Birth: [birth year] Referring Provider: Yves Dill, MD   Encounter Date: 09/03/2017  PT End of Session - 09/03/17 1429    Visit Number  3    Number of Visits  11    Date for PT Re-Evaluation  10/02/17    PT Start Time  1429    PT Stop Time  1518    PT Time Calculation (min)  49 min    Activity Tolerance  Patient tolerated treatment well    Behavior During Therapy  [facility] for tasks assessed/performed       Past Medical History:  Diagnosis Date  . Cardiac murmur    from pt medical record  . Chest pain    from pt medical record  . DM2 (diabetes mellitus, type 2) (HCC)    from pt medical record  . GERD (gastroesophageal reflux disease)    from pt medical record  . History of colon polyps    from pt medical record  . Hypertension   . Onychomycosis    from pt medical record    Past Surgical History:  Procedure Laterality Date  . cholonoscopy  2007, 2015   from pt medical record  . REDUCTION MAMMAPLASTY Bilateral     There were no vitals filed for this visit.  Subjective Assessment - 09/03/17 1431    Subjective  Back is doing ok. No pain or discomfort for the past few days.     Pertinent History  Back pain. Gradual onset about 4 weeks ago. 4 weeks ago, pain began on her R side from her shoulder blade to her low back. Also felt pain in her L shoulder, L lateral neck and lateral arm last weekend.  Denies loss of bowel or bladder control, or saddle anesthesia. Feels tingling L lateral thigh. Back pain arrived, was very very intense, lasted for 5 days, then gradually eased off, then she currently has no back pain.   Pt states that her MD sent her to PT to strengthen her back. L arm is also getting better. 4 weeks ago, pt also went to  a chiropractic adjustment and pt feels like the treatment might have aggravated her pain. Heat did not help. Back started feeling a slight ache around May 2018 when pt caught her husband forward, then pulled him back and laid him to the floor when he passed out. Back bothered her to the point where she had pain going up and down steps (anterior thigh and neck pain until October 2018).       Patient Stated Goals  Pt expresses desire to get better.    Currently in Pain?  No/denies    Pain Score  0-No pain    Pain Onset  1 to 4 weeks ago                               PT Education - 09/03/17 1515    Education provided  Yes    Education Details  ther-ex    [employer]) Educated  Patient    Methods  Explanation;Demonstration;Tactile cues;Verbal cues    Comprehension  Returned demonstration;Verbalized understanding         Objetives  No latex band allergies.   Per ER  note on 07/13/2017: "Pain worsens when lifting the left leg to her pants or with rotation at the waist on the right side."  Standing posture: R posterior pelvic rotation  SLS R pelvic drop > L pelvic drop  Standing trunk rotation: no pain bilaterally     Therapeutic exercise   SLS on LE with emphasis on lumbopelvic control (secondary to hx of increased R low back pain when lifting the L leg to her pants based on her ER note on 07/13/2017) with UE to no UE assist to promote glute med muscle strengthening.   5x5 seconds then 5x 10 seconds then with 5 lbs 10x5 seconds R  5x5 seconds then 5x 10 seconds then with 5 lbs 10x5 seconds L  Standing bilateral shoulder extension [employer] machine plate 10 for 16X0 seconds  Then plate 15 for 96E4 seconds to promote trunk and scapular muscle strengthening  Side stepping resisting resisting yellow band around ankles 32 ft to the R and 32 ft to the L. R hip joint discomfort when stepping to the L .   Forward wedding march 32 ft for 2 sets resisting yellow band  around ankles  Seated physioball rolls flexion to the L (to decrease R low back pressure) 10x5 seconds   Standing hip abduction resisting yellow band around ankle, 10x3 each LE. R hip joint pain with eccentric return from L hip abduction  Seated with upright posture: manual perturbation from PT 1 minute x 3 to promote trunk strengthening    Improved exercise technique, movement at target joints, use of target muscles after mod verbal, visual, tactile cues.    Eccentric return from L hip abduction with resistance in weight bearing increased R hip joint discomfort. No R hip discomfort with eccentric return from L hip abduction without resistance. Worked on glute strengthening to promote pelvic control when standing on R or L LE to help prevent back pain when donning and doffing pants as well as trunk strengthening to decrease lumbar paraspinal muscle tension.            PT Long Term Goals - 08/26/17 1835      PT LONG TERM GOAL #1   Title  Patient will have a decrease in back pain to 4/10 or less at worst to promote ability to perform functional tasks.     Baseline  10/10 back pain at most for the past 2 weeks (08/26/2017)    Time  5    Period  Weeks    Status  New    Target Date  10/02/17      PT LONG TERM GOAL #2   Title  Patient will improve bilateral glute med and max strength by at least 1/2 MMT grade to promote ability to perform functional tasks with minimal to no back pain.     Time  5    Period  Weeks    Status  New    Target Date  10/02/17            Plan - 09/03/17 1516    Clinical Impression Statement  Eccentric return from L hip abduction with resistance in weight bearing increased R hip joint discomfort. No R hip discomfort with eccentric return from L hip abduction without resistance. Worked on glute strengthening to promote pelvic control when standing on R or L LE to help prevent back pain when donning and doffing pants as well as trunk strengthening to  decrease lumbar paraspinal muscle tension.     Rehab  Potential  Good    Clinical Impairments Affecting Rehab Potential  Chronicity of condition, thoracic stiffness    PT Frequency  2x / week    PT Duration  Other (comment) 5 weeks    PT Treatment/Interventions  Therapeutic exercise;Therapeutic activities;Manual techniques;Aquatic Therapy;Electrical Stimulation;Iontophoresis 4mg /ml Dexamethasone;Traction;Neuromuscular re-education;Patient/family education;Dry needling traction if appropriate    PT Next Visit Plan  core strengthening, thoracic mobility, hip and scapular strengthening, manual techniques, modalities PRN    Consulted and Agree with Plan of Care  Patient       Patient will benefit from skilled therapeutic intervention in order to improve the following deficits and impairments:  Postural dysfunction, Pain, Decreased strength, Hypomobility  Visit Diagnosis: Pain in thoracic spine  Right low back pain, unspecified chronicity, with sciatica presence unspecified     Problem List There are no active problems to display for this patient.   Loralyn FreshwaterMiguel Laygo PT, DPT   09/03/2017, 3:31 PM  [facility] [address] [place], [place], [address] Phone: [phone]   Fax:  [phone]  Name: Morgan Ochoa MRN: [id number] Date of Birth: 09/24/1955

## 2017-09-08 ENCOUNTER — Ambulatory Visit: Payer: Managed Care, Other (non HMO)

## 2017-09-08 DIAGNOSIS — M545 Low back pain: Secondary | ICD-10-CM | POA: Diagnosis not present

## 2017-09-08 DIAGNOSIS — M546 Pain in thoracic spine: Secondary | ICD-10-CM

## 2017-09-08 NOTE — Therapy (Signed)
[facility] PHYSICAL AND SPORTS MEDICINE [address] [place], [place], [address] Phone: [phone]   Fax:  [phone]  Physical Therapy Treatment  Patient Details  Name: Morgan Ochoa MRN: [id number] Date of Birth: 03/28/1956 Referring Provider: Yves DillNeelam Khan, MD   Encounter Date: 09/08/2017  PT End of Session - 09/08/17 1438    Visit Number  4    Number of Visits  11    Date for PT Re-Evaluation  10/02/17    PT Start Time  1438 pt arrived late    PT Stop Time  1518    PT Time Calculation (min)  40 min    Activity Tolerance  Patient tolerated treatment well    Behavior During Therapy  [facility] for tasks assessed/performed       Past Medical History:  Diagnosis Date  . Cardiac murmur    from pt medical record  . Chest pain    from pt medical record  . DM2 (diabetes mellitus, type 2) (HCC)    from pt medical record  . GERD (gastroesophageal reflux disease)    from pt medical record  . History of colon polyps    from pt medical record  . Hypertension   . Onychomycosis    from pt medical record    Past Surgical History:  Procedure Laterality Date  . cholonoscopy  2007, 2015   from pt medical record  . REDUCTION MAMMAPLASTY Bilateral     There were no vitals filed for this visit.  Subjective Assessment - 09/08/17 1438    Subjective  Back is doing ok. Not bothering her. L hip hurt all weekend. Also went to a chiropractor this morning for her regular adjustment. 0/10 back pain at most for the past 2 weeks.    Pertinent History  Back pain. Gradual onset about 4 weeks ago. 4 weeks ago, pain began on her R side from her shoulder blade to her low back. Also felt pain in her L shoulder, L lateral neck and lateral arm last weekend.  Denies loss of bowel or bladder control, or saddle anesthesia. Feels tingling L lateral thigh. Back pain arrived, was very very intense, lasted for 5 days, then gradually eased off, then she currently has no back  pain.   Pt states that her MD sent her to PT to strengthen her back. L arm is also getting better. 4 weeks ago, pt also went to a chiropractic adjustment and pt feels like the treatment might have aggravated her pain. Heat did not help. Back started feeling a slight ache around May 2018 when pt caught her husband forward, then pulled him back and laid him to the floor when he passed out. Back bothered her to the point where she had pain going up and down steps (anterior thigh and neck pain until October 2018).       Patient Stated Goals  Pt expresses desire to get better.    Currently in Pain?  No/denies    Pain Score  0-No pain    Pain Onset  1 to 4 weeks ago                               PT Education - 09/08/17 1447    Education provided  Yes    Education Details  ther-ex, HEP    Person(s) Educated  Patient    Methods  Explanation;Demonstration;Tactile cues;Verbal cues;Handout  Comprehension  Returned demonstration;Verbalized understanding          Objetives   No latex band allergies.  09/10/17 last scheduled visit  Also has an eye surgery coming up soon.   Therapeutic exercise   Side stepping without band 32 ft to the R and to the L, then holding 5 lbs each hand 32 ft to the R and L 2x to promote glute med muscle strengthening  Forward wedding march holding onto 5 lbs each hand 32 ft x 4 to promote glute strengthening  SLS on LE with emphasis on lumbopelvic control (secondary to hx of increased R low back pain when lifting the L leg to don/doff her pants based on her ER note on 07/13/2017) with light touch assist, holding onto 5 lbs contralateral hand 15 seconds 2 x 5 each LE to promote glute med strengthening    Reviewed POC: HEP for 2 months after end of plan of care, then DC if not hear from pt to make sure pt is doing ok with her back   Standing low rows resisting blue band 10x5 seconds for 3 sets to promote trunk strengthening  Seated  physioball rolls flexion to the L (to decrease R low back pressure) 5x10 seconds for 2 sets  Seated with upright posture: manual perturbation from PT 2 minutes, then 1 minute  to promote trunk strengthening  Improved exercise technique, movement at target joints, use of target muscles after min to mod verbal, visual, tactile cues.    Continuedworking on trunk and glute muscle strengthening to promote ability to perform standing tasks without aggavation of back pain. No complain of back pain throughout session.     PT Long Term Goals - 08/26/17 1835      PT LONG TERM GOAL #1   Title  Patient will have a decrease in back pain to 4/10 or less at worst to promote ability to perform functional tasks.     Baseline  10/10 back pain at most for the past 2 weeks (08/26/2017)    Time  5    Period  Weeks    Status  New    Target Date  10/02/17      PT LONG TERM GOAL #2   Title  Patient will improve bilateral glute med and max strength by at least 1/2 MMT grade to promote ability to perform functional tasks with minimal to no back pain.     Time  5    Period  Weeks    Status  New    Target Date  10/02/17            Plan - 09/08/17 1733    Clinical Impression Statement  Continuedworking on trunk and glute muscle strengthening to promote ability to perform standing tasks without aggavation of back pain. No complain of back pain throughout session.     Rehab Potential  Good    Clinical Impairments Affecting Rehab Potential  Chronicity of condition, thoracic stiffness    PT Frequency  2x / week    PT Duration  Other (comment) 5 weeks    PT Treatment/Interventions  Therapeutic exercise;Therapeutic activities;Manual techniques;Aquatic Therapy;Electrical Stimulation;Iontophoresis 4mg /ml Dexamethasone;Traction;Neuromuscular re-education;Patient/family education;Dry needling traction if appropriate    PT Next Visit Plan  core strengthening, thoracic mobility, hip and scapular strengthening,  manual techniques, modalities PRN    Consulted and Agree with Plan of Care  Patient       Patient will benefit from skilled therapeutic intervention in order to improve  the following deficits and impairments:  Postural dysfunction, Pain, Decreased strength, Hypomobility  Visit Diagnosis: Pain in thoracic spine  Right low back pain, unspecified chronicity, with sciatica presence unspecified     Problem List There are no active problems to display for this patient.   Loralyn Freshwater PT, DPT   09/08/2017, 5:40 PM  [place] [facility] PHYSICAL AND SPORTS MEDICINE [address] [place], [address] Phone: [phone]   Fax:  [phone]  Name: Morgan Ochoa MRN: [id number] Date of Birth: 03-03-1956

## 2017-09-08 NOTE — Patient Instructions (Addendum)
  Holding onto 5 lbs each hand   Walk side ways to the right about 30 ft, then to the left about 30 ft    Repeat 3 times daily    Also gave SLS holding onto 5 lbs on contralateral hand 15 seconds x 5 for 3 sets daily as part of her HEP. Pt demonstrated and verbalized understanding. Handout provided.

## 2017-09-10 ENCOUNTER — Ambulatory Visit: Payer: Managed Care, Other (non HMO)

## 2017-09-10 DIAGNOSIS — M545 Low back pain: Secondary | ICD-10-CM | POA: Diagnosis not present

## 2017-09-10 DIAGNOSIS — M546 Pain in thoracic spine: Secondary | ICD-10-CM

## 2017-09-10 NOTE — Patient Instructions (Signed)
[facility] Access Code[email or web address]    Sidelying [place] Stretch 10x3 for L trunk rotation

## 2017-09-10 NOTE — Therapy (Signed)
[place] PHYSICAL AND SPORTS MEDICINE [address] [place], [address] Phone: [phone]   Fax:  [phone]  Physical Therapy Treatment  Patient Details  Name: Morgan Ochoa MRN: [id number] Date of Birth: March 10, 1956 Referring Provider: Lamonte Sakai, MD   Encounter Date: 09/10/2017  PT End of Session - 09/10/17 1755    Visit Number  5    Number of Visits  11    Date for PT Re-Evaluation  10/02/17    PT Start Time  9767    PT Stop Time  1850    PT Time Calculation (min)  54 min    Activity Tolerance  Patient tolerated treatment well    Behavior During Therapy  [facility] for tasks assessed/performed       Past Medical History:  Diagnosis Date  . Cardiac murmur    from pt medical record  . Chest pain    from pt medical record  . DM2 (diabetes mellitus, type 2) ([place])    from pt medical record  . GERD (gastroesophageal reflux disease)    from pt medical record  . History of colon polyps    from pt medical record  . Hypertension   . Onychomycosis    from pt medical record    Past Surgical History:  Procedure Laterality Date  . cholonoscopy  2007, 2015   from pt medical record  . REDUCTION MAMMAPLASTY Bilateral     There were no vitals filed for this visit.  Subjective Assessment - 09/10/17 1756    Subjective  Back is a little sore last couple of days. Back was sore Monday.  Pt was also wondering if there is a correlation with back pain and her monthly chiropractor treatment.     Pertinent History  Back pain. Gradual onset about 4 weeks ago. 4 weeks ago, pain began on her R side from her shoulder blade to her low back. Also felt pain in her L shoulder, L lateral neck and lateral arm last weekend.  Denies loss of bowel or bladder control, or saddle anesthesia. Feels tingling L lateral thigh. Back pain arrived, was very very intense, lasted for 5 days, then gradually eased off, then she currently has no back pain.   Pt states  that her MD sent her to PT to strengthen her back. L arm is also getting better. 4 weeks ago, pt also went to a chiropractic adjustment and pt feels like the treatment might have aggravated her pain. Heat did not help. Back started feeling a slight ache around May 2018 when pt caught her husband forward, then pulled him back and laid him to the floor when he passed out. Back bothered her to the point where she had pain going up and down steps (anterior thigh and neck pain until October 2018).       Patient Stated Goals  Pt expresses desire to get better.    Currently in Pain?  Other (Comment) Soreness, no complain of pain    Pain Onset  1 to 4 weeks ago         [facility] PT Assessment - 09/10/17 1809      Strength   Right Hip Extension  4/5    Right Hip ABduction  4+/5    Left Hip Extension  4/5    Left Hip ABduction  4+/5  PT Education - 09/10/17 1855    Education provided  Yes    Education Details  ther-ex, HEP, plan of care    Person(s) Educated  Patient    Methods  Explanation;Demonstration;Tactile cues;Verbal cues;Handout    Comprehension  Returned demonstration;Verbalized understanding          Objetives  MedBridge Access Code: [employer]    No latex band allergies.  09/10/17 last scheduled visit  Also has an eye surgery coming up soon.   1/10 back pain at most for the past 7 days  Pt was also wondering if there is a correlation with back pain and her monthly chiropractor treatment.   Therapeutic exercise  Seated bilateral shoulder extension isometrics , hands on thighs 10x10 seconds  Standing trunk AROM  Flexion. 10x. No increase or decrease in soreness  Extension. No change in soreness   R side bend: R posterior lateral trunk symptoms  L side bend: no symptoms   Seated R trunk rotation: R posterior lateral trunk symptoms   Seated L trunk rotation: More R posterior lateral trunk symptoms compared to R trunk  rotation  Prone manually resisted hip extension, S/L hip abduction 1x each way for each LE  Reviewed progress/current status with hip strength with pt.   R S/L bows and arrows 10x3  Decreased back tightness  Quadruped:    L trunk rotation arm reach 10x3. Decreased back tightness.  Standing LE leg press resisting triple black theratube with bilateral UE assist  10x L  10x2 for R. Decreased R lumbar paraspinal muscle tension palapated. No back soreness afterwards  Standing L trunk side bend 5x 10 seconds for 2 sets. L hip discomfort.   Reviewed HEP. Pt demonstrated and verbalized understanding. Handout provided.    Reviewed POC: HEP for 2 months after end of plan of care. Pt to call to let us know whether or not she needs mor PT for her back. Pt verbalized understanding.     Improved exercise technique, movement at target joints, use of target muscles after mod verbal, visual, tactile cues.    Pt demonstrates overall improved bilateral hip strength since initial evaluation and continues to have minimal to no back pain. Decreased R back symptoms with exercises to promote L trunk rotation and R glute muscle activation. No soreness or ache after session.         PT Long Term Goals - 09/10/17 1901      PT LONG TERM GOAL #1   Title  Patient will have a decrease in back pain to 4/10 or less at worst to promote ability to perform functional tasks.     Baseline  10/10 back pain at most for the past 2 weeks (08/26/2017); 1/10 (09/10/2017)    Time  5    Period  Weeks    Status  Achieved    Target Date  10/02/17      PT LONG TERM GOAL #2   Title  Patient will improve bilateral glute med and max strength by at least 1/2 MMT grade to promote ability to perform functional tasks with minimal to no back pain.     Time  5    Period  Weeks    Status  Partially Met    Target Date  10/02/17            Plan - 09/10/17 1856    Clinical Impression Statement  Pt demonstrates overall  improved bilateral hip strength since initial evaluation and continues to have minimal  to no back pain. Decreased R back symptoms with exercises to promote L trunk rotation and R glute muscle activation. No soreness or ache after session.      Rehab Potential  Good    Clinical Impairments Affecting Rehab Potential  Chronicity of condition, thoracic stiffness    PT Frequency  2x / week    PT Duration  Other (comment) 5 weeks    PT Treatment/Interventions  Therapeutic exercise;Therapeutic activities;Manual techniques;Aquatic Therapy;Electrical Stimulation;Iontophoresis 21m/ml Dexamethasone;Traction;Neuromuscular re-education;Patient/family education;Dry needling traction if appropriate    PT Next Visit Plan  core strengthening, thoracic mobility, hip and scapular strengthening, manual techniques, modalities PRN    Consulted and Agree with Plan of Care  Patient       Patient will benefit from skilled therapeutic intervention in order to improve the following deficits and impairments:  Postural dysfunction, Pain, Decreased strength, Hypomobility  Visit Diagnosis: Pain in thoracic spine  Right low back pain, unspecified chronicity, with sciatica presence unspecified     Problem List There are no active problems to display for this patient.   MJoneen BoersPT, DPT   09/10/2017, 7:01 PM  [facility] [address] [place] [address]Phone: [phone]  Fax:  [phone] Name: Morgan KIRSCHENBAUMMRN: [id number] of Birth: 907/05/1955

## 2017-09-15 ENCOUNTER — Telehealth: Payer: Self-pay

## 2017-09-15 NOTE — Telephone Encounter (Signed)
Attempted to call pt back but unable to reach her. Left a message returning pt phone call to number provided [phone]).

## 2017-09-26 ENCOUNTER — Ambulatory Visit
Admission: RE | Admit: 2017-09-26 | Discharge: 2017-09-26 | Disposition: A | Discharge status: Home / Self Care | Payer: Managed Care, Other (non HMO) | Source: Ambulatory Visit | Attending: Internal Medicine | Admitting: Internal Medicine

## 2017-09-26 DIAGNOSIS — Z1231 Encounter for screening mammogram for malignant neoplasm of breast: Secondary | ICD-10-CM | POA: Diagnosis present

## 2018-03-23 ENCOUNTER — Other Ambulatory Visit: Payer: Self-pay | Admitting: Family

## 2018-03-23 DIAGNOSIS — R945 Abnormal results of liver function studies: Secondary | ICD-10-CM

## 2018-03-27 ENCOUNTER — Ambulatory Visit
Admission: RE | Admit: 2018-03-27 | Discharge: 2018-03-27 | Disposition: A | Discharge status: Home / Self Care | Payer: BLUE CROSS/BLUE SHIELD | Source: Ambulatory Visit | Attending: Family | Admitting: Family

## 2018-03-27 DIAGNOSIS — R748 Abnormal levels of other serum enzymes: Secondary | ICD-10-CM | POA: Insufficient documentation

## 2018-03-27 DIAGNOSIS — R945 Abnormal results of liver function studies: Secondary | ICD-10-CM

## 2018-08-10 ENCOUNTER — Other Ambulatory Visit: Payer: Self-pay | Admitting: Internal Medicine

## 2018-08-10 DIAGNOSIS — R102 Pelvic and perineal pain: Secondary | ICD-10-CM

## 2018-09-08 ENCOUNTER — Other Ambulatory Visit: Payer: Self-pay

## 2018-09-08 ENCOUNTER — Ambulatory Visit
Admission: RE | Admit: 2018-09-08 | Discharge: 2018-09-08 | Disposition: A | Discharge status: Home / Self Care | Payer: Managed Care, Other (non HMO) | Source: Ambulatory Visit | Attending: Internal Medicine | Admitting: Internal Medicine

## 2018-09-08 DIAGNOSIS — R102 Pelvic and perineal pain: Secondary | ICD-10-CM | POA: Insufficient documentation

## 2018-09-14 ENCOUNTER — Other Ambulatory Visit: Payer: Self-pay | Admitting: Family

## 2018-09-14 ENCOUNTER — Other Ambulatory Visit: Payer: Self-pay | Admitting: Internal Medicine

## 2018-09-14 DIAGNOSIS — Z1231 Encounter for screening mammogram for malignant neoplasm of breast: Secondary | ICD-10-CM

## 2018-10-03 IMAGING — MG MM DIGITAL SCREENING BILAT W/ TOMO W/ CAD
8 series · 8 of 24 positions shown · non-contrast
Comparison: Previous exam(s).

CLINICAL DATA: Screening.

EXAM:
DIGITAL SCREENING BILATERAL MAMMOGRAM WITH TOMO AND CAD

[L CC synth-2D]
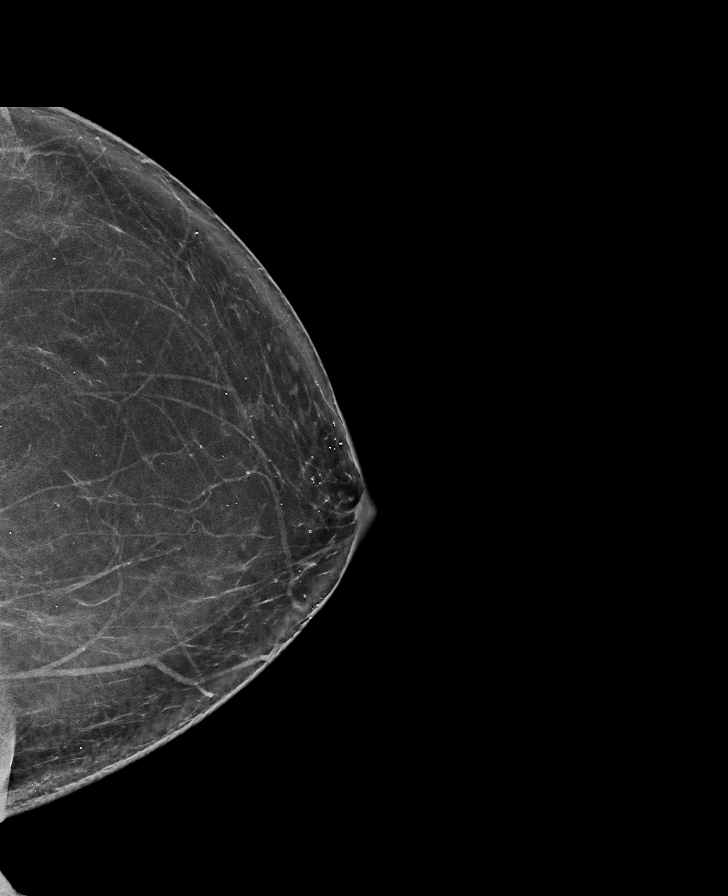

[R CC synth-2D]
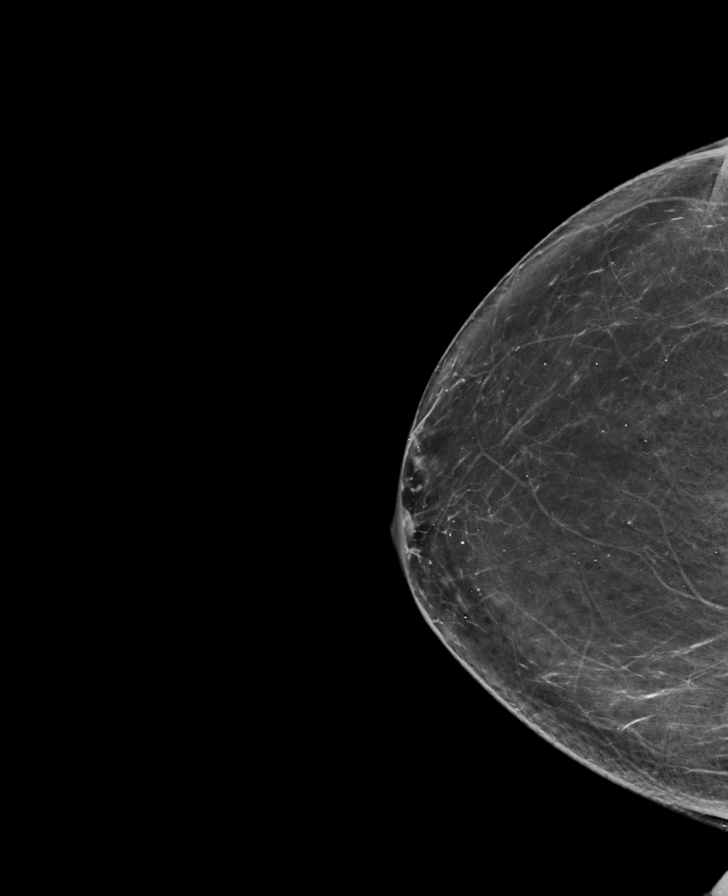

[L MLO synth-2D]
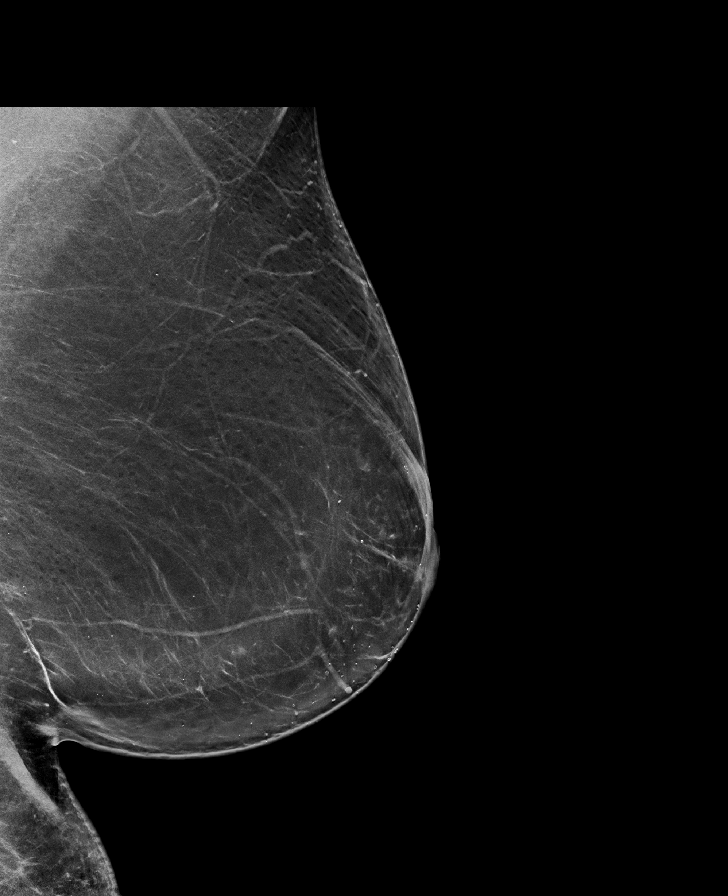

[R MLO synth-2D]
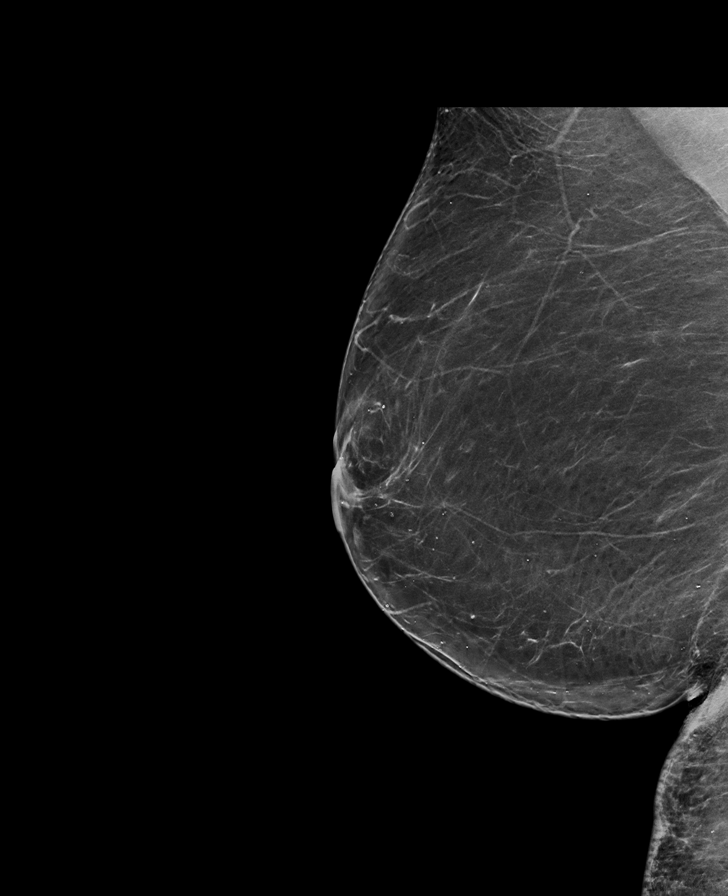

[L CC tomo · tomo slice 40/79.0]
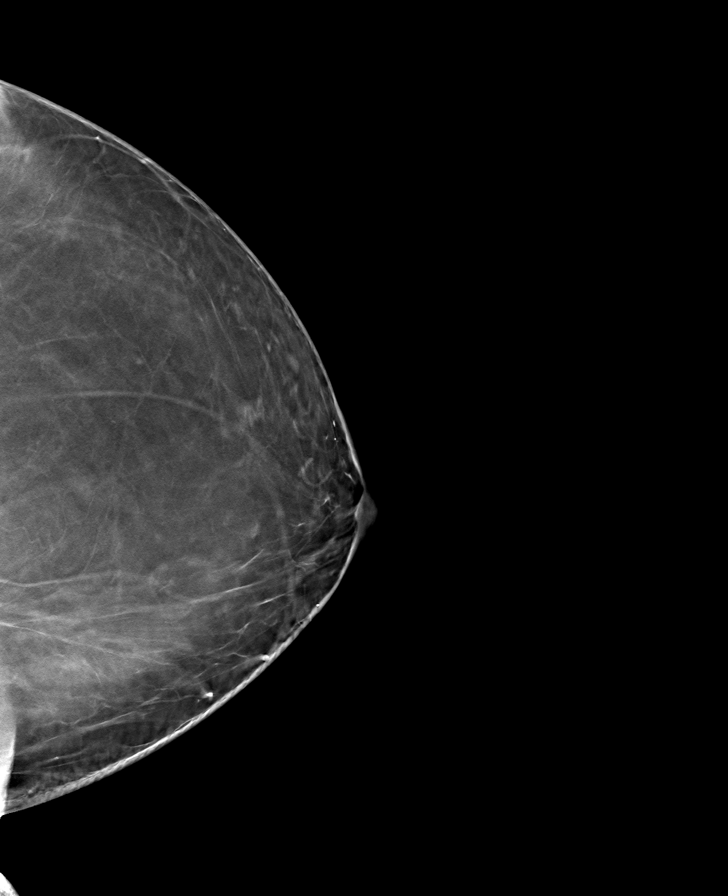

[R MLO tomo · tomo slice 43/85.0]
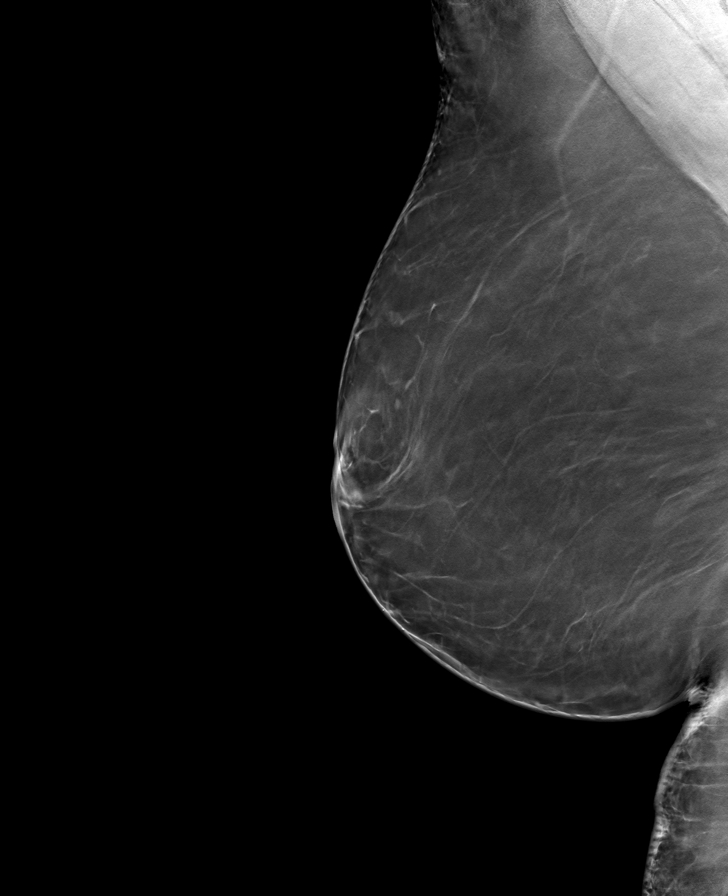

[R CC tomo · tomo slice 39/76.0]
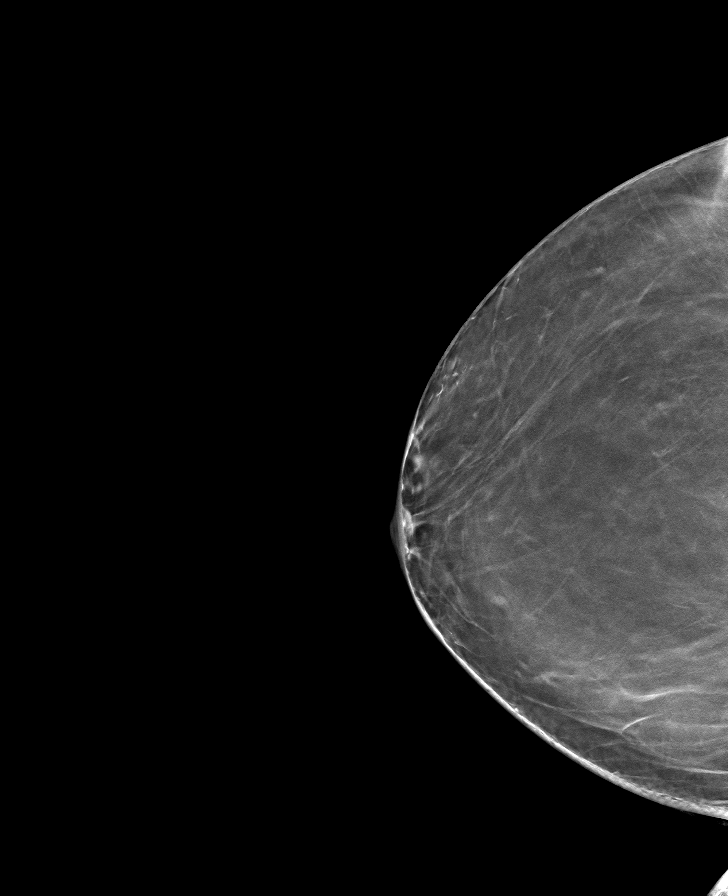

[L MLO tomo · tomo slice 47/92.0]
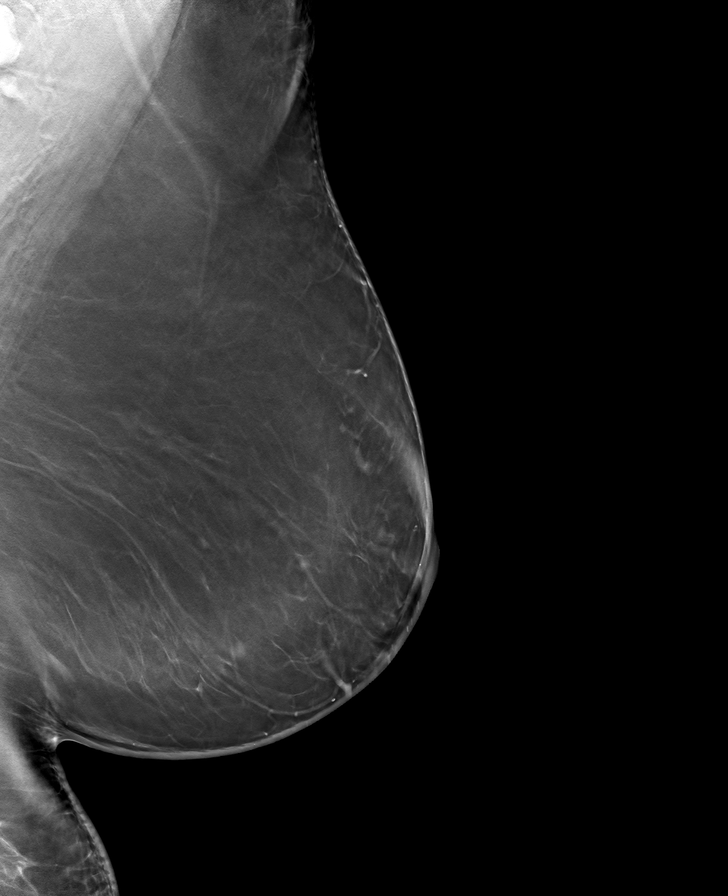

[8 of 24 positions shown; findings below may reference images not displayed]

ACR Breast Density Category b: There are scattered areas of
fibroglandular density.
FINDINGS: There are no findings suspicious for malignancy. Images were
processed with CAD.
IMPRESSION: No mammographic evidence of malignancy. A result letter of this
screening mammogram will be mailed directly to the patient.

RECOMMENDATION:
Screening mammogram in one year. (Code:[id number])

BI-RADS CATEGORY  1: Negative.

## 2018-11-13 ENCOUNTER — Other Ambulatory Visit: Payer: Self-pay

## 2018-11-13 ENCOUNTER — Ambulatory Visit
Admission: RE | Admit: 2018-11-13 | Discharge: 2018-11-13 | Disposition: A | Discharge status: Home / Self Care | Payer: Managed Care, Other (non HMO) | Source: Ambulatory Visit | Attending: Family | Admitting: Family

## 2018-11-13 DIAGNOSIS — Z1231 Encounter for screening mammogram for malignant neoplasm of breast: Secondary | ICD-10-CM | POA: Diagnosis not present

## 2018-12-03 ENCOUNTER — Other Ambulatory Visit: Payer: Self-pay | Admitting: *Deleted

## 2018-12-03 DIAGNOSIS — Z20822 Contact with and (suspected) exposure to covid-19: Secondary | ICD-10-CM

## 2018-12-06 LAB — NOVEL CORONAVIRUS, NAA: SARS-CoV-2, NAA: NOT DETECTED

## 2018-12-14 ENCOUNTER — Telehealth: Payer: Self-pay | Admitting: Internal Medicine

## 2018-12-14 NOTE — Telephone Encounter (Signed)
Patient informed of negative test results.

## 2019-05-20 ENCOUNTER — Other Ambulatory Visit: Payer: Self-pay | Admitting: Internal Medicine

## 2019-05-20 DIAGNOSIS — R102 Pelvic and perineal pain: Secondary | ICD-10-CM

## 2019-05-31 ENCOUNTER — Other Ambulatory Visit: Payer: Self-pay

## 2019-05-31 ENCOUNTER — Ambulatory Visit
Admission: RE | Admit: 2019-05-31 | Discharge: 2019-05-31 | Disposition: A | Discharge status: Home / Self Care | Payer: Medicare HMO | Source: Ambulatory Visit | Attending: Internal Medicine | Admitting: Internal Medicine

## 2019-05-31 DIAGNOSIS — R102 Pelvic and perineal pain: Secondary | ICD-10-CM | POA: Insufficient documentation

## 2019-06-24 ENCOUNTER — Other Ambulatory Visit: Payer: Self-pay | Admitting: Internal Medicine

## 2019-06-24 DIAGNOSIS — R109 Unspecified abdominal pain: Secondary | ICD-10-CM

## 2019-06-28 ENCOUNTER — Ambulatory Visit: Payer: Medicare HMO | Attending: Internal Medicine

## 2019-06-28 DIAGNOSIS — Z23 Encounter for immunization: Secondary | ICD-10-CM | POA: Insufficient documentation

## 2019-06-28 NOTE — Progress Notes (Incomplete)
   Covid-19 Vaccination Clinic  Name:  Morgan Ochoa    MRN: [id number] DOB: Feb 21, 1956  06/28/2019  [name] was observed post Covid-19 immunization for {COVID Vaccine Observation Times:23551} without incidence. She was provided with Vaccine Information Sheet and instruction to access the V-Safe system.   [name] was instructed to call 911 with any severe reactions post vaccine: Marland Kitchen Difficulty breathing  . Swelling of your face and throat  . A fast heartbeat  . A bad rash all over your body  . Dizziness and weakness    Immunizations Administered    Name Date Dose VIS Date Route   Pfizer COVID-19 Vaccine 06/28/2019  3:51 PM 0.3 mL 04/30/2019 Intramuscular   Manufacturer: [employer], [employer]   Lot: [id number]   NDC: [address]-4

## 2019-07-02 ENCOUNTER — Other Ambulatory Visit: Payer: Self-pay

## 2019-07-02 ENCOUNTER — Ambulatory Visit
Admission: RE | Admit: 2019-07-02 | Discharge: 2019-07-02 | Disposition: A | Discharge status: Home / Self Care | Payer: Medicare HMO | Source: Ambulatory Visit | Attending: Internal Medicine | Admitting: Internal Medicine

## 2019-07-02 DIAGNOSIS — R109 Unspecified abdominal pain: Secondary | ICD-10-CM | POA: Insufficient documentation

## 2019-07-23 ENCOUNTER — Ambulatory Visit: Payer: Medicare HMO | Attending: Internal Medicine

## 2019-07-23 DIAGNOSIS — Z23 Encounter for immunization: Secondary | ICD-10-CM

## 2019-07-23 NOTE — Progress Notes (Signed)
   Covid-19 Vaccination Clinic  Name:  Morgan Ochoa    MRN: [id number] DOB: 14-Feb-1956  07/23/2019  [name] was observed post Covid-19 immunization for 15 minutes without incident. She was provided with Vaccine Information Sheet and instruction to access the V-Safe system.   [name] was instructed to call 911 with any severe reactions post vaccine: Marland Kitchen Difficulty breathing  . Swelling of face and throat  . A fast heartbeat  . A bad rash all over body  . Dizziness and weakness   Immunizations Administered    Name Date Dose VIS Date Route   Pfizer COVID-19 Vaccine 07/23/2019  3:36 PM 0.3 mL 04/30/2019 Intramuscular   Manufacturer: [employer], [employer]   Lot: [id number]   NDC: [address]-2

## 2019-09-20 ENCOUNTER — Other Ambulatory Visit: Payer: Self-pay | Admitting: Internal Medicine

## 2019-09-20 DIAGNOSIS — Z1231 Encounter for screening mammogram for malignant neoplasm of breast: Secondary | ICD-10-CM

## 2019-11-15 ENCOUNTER — Ambulatory Visit
Admission: RE | Admit: 2019-11-15 | Discharge: 2019-11-15 | Disposition: A | Discharge status: Home / Self Care | Payer: Medicare HMO | Source: Ambulatory Visit | Attending: Internal Medicine | Admitting: Internal Medicine

## 2019-11-15 DIAGNOSIS — Z1231 Encounter for screening mammogram for malignant neoplasm of breast: Secondary | ICD-10-CM | POA: Insufficient documentation

## 2020-09-26 ENCOUNTER — Other Ambulatory Visit: Payer: Self-pay | Admitting: Internal Medicine

## 2020-09-26 DIAGNOSIS — Z1231 Encounter for screening mammogram for malignant neoplasm of breast: Secondary | ICD-10-CM

## 2020-11-15 ENCOUNTER — Other Ambulatory Visit: Payer: Self-pay

## 2020-11-15 ENCOUNTER — Ambulatory Visit
Admission: RE | Admit: 2020-11-15 | Discharge: 2020-11-15 | Disposition: A | Discharge status: Home / Self Care | Payer: Medicare HMO | Source: Ambulatory Visit | Attending: Internal Medicine | Admitting: Internal Medicine

## 2020-11-15 DIAGNOSIS — Z1231 Encounter for screening mammogram for malignant neoplasm of breast: Secondary | ICD-10-CM | POA: Diagnosis present

## 2021-11-06 ENCOUNTER — Other Ambulatory Visit: Payer: Self-pay | Admitting: Internal Medicine

## 2021-11-06 DIAGNOSIS — Z1231 Encounter for screening mammogram for malignant neoplasm of breast: Secondary | ICD-10-CM

## 2022-02-05 ENCOUNTER — Ambulatory Visit
Admission: RE | Admit: 2022-02-05 | Discharge: 2022-02-05 | Disposition: A | Discharge status: Home / Self Care | Payer: Medicare HMO | Source: Ambulatory Visit | Attending: Internal Medicine | Admitting: Internal Medicine

## 2022-02-05 DIAGNOSIS — Z1231 Encounter for screening mammogram for malignant neoplasm of breast: Secondary | ICD-10-CM | POA: Diagnosis present

## 2022-08-15 ENCOUNTER — Telehealth: Payer: Self-pay

## 2022-08-15 NOTE — Telephone Encounter (Signed)
Pt called and left vm regarding that she had a fall asked if there is any kind of anti-inflammatory you want her to take? Please advise

## 2022-08-19 ENCOUNTER — Other Ambulatory Visit: Payer: Self-pay

## 2022-08-19 MED ORDER — MELOXICAM 15 MG PO TABS: 15.0000 mg | ORAL_TABLET | Freq: Every day | ORAL | 1 refills | Status: DC

## 2022-09-09 ENCOUNTER — Encounter: Payer: Self-pay | Admitting: Internal Medicine

## 2022-09-13 ENCOUNTER — Other Ambulatory Visit: Payer: Self-pay | Admitting: Internal Medicine

## 2022-09-13 ENCOUNTER — Ambulatory Visit: Payer: Managed Care, Other (non HMO) | Admitting: Internal Medicine

## 2022-09-13 DIAGNOSIS — E782 Mixed hyperlipidemia: Secondary | ICD-10-CM

## 2022-09-30 ENCOUNTER — Ambulatory Visit (INDEPENDENT_AMBULATORY_CARE_PROVIDER_SITE_OTHER): Payer: Managed Care, Other (non HMO) | Admitting: Internal Medicine

## 2022-09-30 VITALS — BP 130/72 | HR 94 | Ht 66.0 in | Wt 168.0 lb

## 2022-09-30 DIAGNOSIS — I10 Essential (primary) hypertension: Secondary | ICD-10-CM | POA: Diagnosis not present

## 2022-09-30 DIAGNOSIS — E038 Other specified hypothyroidism: Secondary | ICD-10-CM | POA: Diagnosis not present

## 2022-09-30 DIAGNOSIS — E1165 Type 2 diabetes mellitus with hyperglycemia: Secondary | ICD-10-CM

## 2022-09-30 DIAGNOSIS — E782 Mixed hyperlipidemia: Secondary | ICD-10-CM

## 2022-09-30 DIAGNOSIS — M1000 Idiopathic gout, unspecified site: Secondary | ICD-10-CM | POA: Diagnosis not present

## 2022-09-30 LAB — POCT CBG (FASTING - GLUCOSE)-MANUAL ENTRY: Glucose Fasting, POC: 117 mg/dL — AB (ref 70–99)

## 2022-09-30 MED ORDER — METOPROLOL SUCCINATE ER 25 MG PO TB24: 25.0000 mg | ORAL_TABLET | Freq: Every day | ORAL | 3 refills | Status: DC

## 2022-10-01 ENCOUNTER — Encounter: Payer: Self-pay | Admitting: Internal Medicine

## 2022-10-01 DIAGNOSIS — E782 Mixed hyperlipidemia: Secondary | ICD-10-CM | POA: Insufficient documentation

## 2022-10-01 DIAGNOSIS — E1165 Type 2 diabetes mellitus with hyperglycemia: Secondary | ICD-10-CM | POA: Insufficient documentation

## 2022-10-01 DIAGNOSIS — I10 Essential (primary) hypertension: Secondary | ICD-10-CM | POA: Insufficient documentation

## 2022-10-01 DIAGNOSIS — E038 Other specified hypothyroidism: Secondary | ICD-10-CM | POA: Insufficient documentation

## 2022-10-01 DIAGNOSIS — M1000 Idiopathic gout, unspecified site: Secondary | ICD-10-CM | POA: Insufficient documentation

## 2022-10-01 LAB — CMP14+EGFR
ALT: 18 IU/L (ref 0–32)
AST: 22 IU/L (ref 0–40)
Albumin/Globulin Ratio: 1.5 (ref 1.2–2.2)
Albumin: 4.6 g/dL (ref 3.9–4.9)
Alkaline Phosphatase: 58 IU/L (ref 44–121)
BUN/Creatinine Ratio: 27 (ref 12–28)
BUN: 16 mg/dL (ref 8–27)
Bilirubin Total: 0.3 mg/dL (ref 0.0–1.2)
CO2: 24 mmol/L (ref 20–29)
Calcium: 9.9 mg/dL (ref 8.7–10.3)
Chloride: 105 mmol/L (ref 96–106)
Creatinine, Ser: 0.6 mg/dL (ref 0.57–1.00)
Globulin, Total: 3.1 g/dL (ref 1.5–4.5)
Glucose: 112 mg/dL — ABNORMAL HIGH (ref 70–99)
Potassium: 4.7 mmol/L (ref 3.5–5.2)
Sodium: 146 mmol/L — ABNORMAL HIGH (ref 134–144)
Total Protein: 7.7 g/dL (ref 6.0–8.5)
eGFR: 98 mL/min/{1.73_m2} (ref >59)

## 2022-10-01 LAB — LIPID PANEL
Chol/HDL Ratio: 3.3 ratio (ref 0.0–4.4)
Cholesterol, Total: 80 mg/dL — ABNORMAL LOW (ref 100–199)
HDL: 24 mg/dL — ABNORMAL LOW (ref >39)
LDL Chol Calc (NIH): 38 mg/dL (ref 0–99)
Triglycerides: 88 mg/dL (ref 0–149)
VLDL Cholesterol Cal: 18 mg/dL (ref 5–40)

## 2022-10-01 LAB — CBC WITH DIFFERENTIAL
Basophils Absolute: 0.1 10*3/uL (ref 0.0–0.2)
Basos: 1 %
EOS (ABSOLUTE): 0.4 10*3/uL (ref 0.0–0.4)
Eos: 5 %
Hematocrit: 41.2 % (ref 34.0–46.6)
Hemoglobin: 13.1 g/dL (ref 11.1–15.9)
Immature Grans (Abs): 0 10*3/uL (ref 0.0–0.1)
Immature Granulocytes: 0 %
Lymphocytes Absolute: 2.3 10*3/uL (ref 0.7–3.1)
Lymphs: 28 %
MCH: 25.8 pg — ABNORMAL LOW (ref 26.6–33.0)
MCHC: 31.8 g/dL (ref 31.5–35.7)
MCV: 81 fL (ref 79–97)
Monocytes Absolute: 0.7 10*3/uL (ref 0.1–0.9)
Monocytes: 8 %
Neutrophils Absolute: 4.9 10*3/uL (ref 1.4–7.0)
Neutrophils: 58 %
RBC: 5.07 x10E6/uL (ref 3.77–5.28)
RDW: 13.8 % (ref 11.7–15.4)
WBC: 8.4 10*3/uL (ref 3.4–10.8)

## 2022-10-01 LAB — URIC ACID: Uric Acid: 2.2 mg/dL — ABNORMAL LOW (ref 3.0–7.2)

## 2022-10-01 LAB — TSH: TSH: 0.218 u[IU]/mL — ABNORMAL LOW (ref 0.450–4.500)

## 2022-10-01 LAB — HEMOGLOBIN A1C
Est. average glucose Bld gHb Est-mCnc: 166 mg/dL
Hgb A1c MFr Bld: 7.4 % — ABNORMAL HIGH (ref 4.8–5.6)

## 2022-10-01 NOTE — Progress Notes (Signed)
Established Patient Office Visit  Subjective:  Patient ID: Morgan Ochoa, female    DOB: 02-07-1956  Age: 67 y.o. MRN: [id number]  Chief Complaint  Patient presents with   Follow-up    3 month follow up    Patient comes in for her follow-up today.  She is generally feeling well and staying busy.  She is fasting for blood work.  Mammogram is not due until September.    No other concerns at this time.   Past Medical History:  Diagnosis Date   Cardiac murmur    from pt medical record   Chest pain    from pt medical record   DM2 (diabetes mellitus, type 2) (HCC)    from pt medical record   GERD (gastroesophageal reflux disease)    from pt medical record   History of colon polyps    from pt medical record   Hypertension    Onychomycosis    from pt medical record    Past Surgical History:  Procedure Laterality Date   ABDOMINAL HYSTERECTOMY     cholonoscopy  2007, 2015   from pt medical record   OOPHORECTOMY     REDUCTION MAMMAPLASTY Bilateral     Social History   Socioeconomic History   Marital status: Married    Spouse name: Not on file   Number of children: Not on file   Years of education: Not on file   Highest education level: Not on file  Occupational History   Not on file  Tobacco Use   Smoking status: Never   Smokeless tobacco: Never  Substance and Sexual Activity   Alcohol use: No   Drug use: No   Sexual activity: Not on file  Other Topics Concern   Not on file  Social History Narrative   Not on file   Social Determinants of Health   Financial Resource Strain: Not on file  Food Insecurity: Not on file  Transportation Needs: Not on file  Physical Activity: Not on file  Stress: Not on file  Social Connections: Not on file  Intimate Partner Violence: Not on file    Family History  Problem Relation Age of Onset   Hypertension Mother     Allergies  Allergen Reactions   Ace Inhibitors     Review of Systems  Constitutional:   Negative for chills, diaphoresis, fever, malaise/fatigue and weight loss.  HENT: Negative.    Eyes: Negative.   Respiratory:  Negative for cough, shortness of breath and wheezing.   Cardiovascular:  Negative for chest pain, palpitations, leg swelling and PND.  Gastrointestinal:  Negative for abdominal pain, diarrhea, heartburn, melena, nausea and vomiting.  Genitourinary: Negative.   Musculoskeletal: Negative.   Skin:  Negative for rash.  Neurological: Negative.   Psychiatric/Behavioral: Negative.         Objective:   BP 130/72   Pulse 94   Ht 5\' 6"  (1.676 m)   Wt 168 lb (76.2 kg)   SpO2 97%   BMI 27.12 kg/m   Vitals:   09/30/22 1103  BP: 130/72  Pulse: 94  Height: 5\' 6"  (1.676 m)  Weight: 168 lb (76.2 kg)  SpO2: 97%  BMI (Calculated): 27.13    Physical Exam Vitals and nursing note reviewed.  Constitutional:      Appearance: Normal appearance.  HENT:     Head: Normocephalic.  Cardiovascular:     Rate and Rhythm: Normal rate and regular rhythm.     Pulses: Normal pulses.  Heart sounds: Normal heart sounds. No murmur heard. Pulmonary:     Effort: Pulmonary effort is normal.     Breath sounds: Normal breath sounds.  Abdominal:     General: Bowel sounds are normal.     Tenderness: There is no right CVA tenderness or left CVA tenderness.  Musculoskeletal:        General: No swelling. Normal range of motion.     Cervical back: Normal range of motion.     Right lower leg: No edema.     Left lower leg: No edema.  Skin:    General: Skin is warm and dry.  Neurological:     General: No focal deficit present.     Mental Status: She is alert.  Psychiatric:        Mood and Affect: Mood normal.        Behavior: Behavior normal.      Results for orders placed or performed in visit on 09/30/22  CMP14+EGFR  Result Value Ref Range   Glucose 112 (H) 70 - 99 mg/dL   BUN 16 8 - 27 mg/dL   Creatinine, Ser 8.29 0.57 - 1.00 mg/dL   eGFR 98 >56 OZ/HYQ/6.57    BUN/Creatinine Ratio 27 12 - 28   Sodium 146 (H) 134 - 144 mmol/L   Potassium 4.7 3.5 - 5.2 mmol/L   Chloride 105 96 - 106 mmol/L   CO2 24 20 - 29 mmol/L   Calcium 9.9 8.7 - 10.3 mg/dL   Total Protein 7.7 6.0 - 8.5 g/dL   Albumin 4.6 3.9 - 4.9 g/dL   Globulin, Total 3.1 1.5 - 4.5 g/dL   Albumin/Globulin Ratio 1.5 1.2 - 2.2   Bilirubin Total 0.3 0.0 - 1.2 mg/dL   Alkaline Phosphatase 58 44 - 121 IU/L   AST 22 0 - 40 IU/L   ALT 18 0 - 32 IU/L  Lipid Profile  Result Value Ref Range   Cholesterol, Total 80 (L) 100 - 199 mg/dL   Triglycerides 88 0 - 149 mg/dL   HDL 24 (L) >84 mg/dL   VLDL Cholesterol Cal 18 5 - 40 mg/dL   LDL Chol Calc (NIH) 38 0 - 99 mg/dL   Chol/HDL Ratio 3.3 0.0 - 4.4 ratio  Hemoglobin A1c  Result Value Ref Range   Hgb A1c MFr Bld 7.4 (H) 4.8 - 5.6 %   Est. average glucose Bld gHb Est-mCnc 166 mg/dL  TSH  Result Value Ref Range   TSH 0.218 (L) 0.450 - 4.500 uIU/mL  CBC With Differential  Result Value Ref Range   WBC 8.4 3.4 - 10.8 x10E3/uL   RBC 5.07 3.77 - 5.28 x10E6/uL   Hemoglobin 13.1 11.1 - 15.9 g/dL   Hematocrit 69.6 29.5 - 46.6 %   MCV 81 79 - 97 fL   MCH 25.8 (L) 26.6 - 33.0 pg   MCHC 31.8 31.5 - 35.7 g/dL   RDW 28.4 13.2 - 44.0 %   Neutrophils 58 Not Estab. %   Lymphs 28 Not Estab. %   Monocytes 8 Not Estab. %   Eos 5 Not Estab. %   Basos 1 Not Estab. %   Neutrophils Absolute 4.9 1.4 - 7.0 x10E3/uL   Lymphocytes Absolute 2.3 0.7 - 3.1 x10E3/uL   Monocytes Absolute 0.7 0.1 - 0.9 x10E3/uL   EOS (ABSOLUTE) 0.4 0.0 - 0.4 x10E3/uL   Basophils Absolute 0.1 0.0 - 0.2 x10E3/uL   Immature Granulocytes 0 Not Estab. %   Immature Grans (Abs)  0.0 0.0 - 0.1 x10E3/uL  Uric acid  Result Value Ref Range   Uric Acid 2.2 (L) 3.0 - 7.2 mg/dL  POCT CBG (Fasting - Glucose)  Result Value Ref Range   Glucose Fasting, POC 117 (A) 70 - 99 mg/dL    Recent Results (from the past 2160 hour(s))  POCT CBG (Fasting - Glucose)     Status: Abnormal   Collection  Time: 09/30/22 11:07 AM  Result Value Ref Range   Glucose Fasting, POC 117 (A) 70 - 99 mg/dL  [id number]     Status: Abnormal   Collection Time: 09/30/22 11:59 AM  Result Value Ref Range   Glucose 112 (H) 70 - 99 mg/dL   BUN 16 8 - 27 mg/dL   Creatinine, Ser 4.09 0.57 - 1.00 mg/dL   eGFR 98 >81 XB/JYN/8.29   BUN/Creatinine Ratio 27 12 - 28   Sodium 146 (H) 134 - 144 mmol/L   Potassium 4.7 3.5 - 5.2 mmol/L   Chloride 105 96 - 106 mmol/L   CO2 24 20 - 29 mmol/L   Calcium 9.9 8.7 - 10.3 mg/dL   Total Protein 7.7 6.0 - 8.5 g/dL   Albumin 4.6 3.9 - 4.9 g/dL   Globulin, Total 3.1 1.5 - 4.5 g/dL   Albumin/Globulin Ratio 1.5 1.2 - 2.2   Bilirubin Total 0.3 0.0 - 1.2 mg/dL   Alkaline Phosphatase 58 44 - 121 IU/L   AST 22 0 - 40 IU/L   ALT 18 0 - 32 IU/L  Lipid Profile     Status: Abnormal   Collection Time: 09/30/22 11:59 AM  Result Value Ref Range   Cholesterol, Total 80 (L) 100 - 199 mg/dL   Triglycerides 88 0 - 149 mg/dL   HDL 24 (L) >56 mg/dL   VLDL Cholesterol Cal 18 5 - 40 mg/dL   LDL Chol Calc (NIH) 38 0 - 99 mg/dL   Chol/HDL Ratio 3.3 0.0 - 4.4 ratio    Comment:                                   T. Chol/HDL Ratio                                             Men  Women                               1/2 Avg.Risk  3.4    3.3                                   Avg.Risk  5.0    4.4                                2X Avg.Risk  9.6    7.1                                3X Avg.Risk 23.4   11.0   Hemoglobin A1c     Status: Abnormal   Collection Time: 09/30/22 11:59 AM  Result Value Ref  Range   Hgb A1c MFr Bld 7.4 (H) 4.8 - 5.6 %    Comment:          Prediabetes: 5.7 - 6.4          Diabetes: >6.4          Glycemic control for adults with diabetes: <7.0    Est. average glucose Bld gHb Est-mCnc 166 mg/dL  TSH     Status: Abnormal   Collection Time: 09/30/22 11:59 AM  Result Value Ref Range   TSH 0.218 (L) 0.450 - 4.500 uIU/mL  CBC With Differential     Status: Abnormal    Collection Time: 09/30/22 11:59 AM  Result Value Ref Range   WBC 8.4 3.4 - 10.8 x10E3/uL   RBC 5.07 3.77 - 5.28 x10E6/uL   Hemoglobin 13.1 11.1 - 15.9 g/dL   Hematocrit 16.1 09.6 - 46.6 %   MCV 81 79 - 97 fL   MCH 25.8 (L) 26.6 - 33.0 pg   MCHC 31.8 31.5 - 35.7 g/dL   RDW 04.5 40.9 - 81.1 %   Neutrophils 58 Not Estab. %   Lymphs 28 Not Estab. %   Monocytes 8 Not Estab. %   Eos 5 Not Estab. %   Basos 1 Not Estab. %   Neutrophils Absolute 4.9 1.4 - 7.0 x10E3/uL   Lymphocytes Absolute 2.3 0.7 - 3.1 x10E3/uL   Monocytes Absolute 0.7 0.1 - 0.9 x10E3/uL   EOS (ABSOLUTE) 0.4 0.0 - 0.4 x10E3/uL   Basophils Absolute 0.1 0.0 - 0.2 x10E3/uL   Immature Granulocytes 0 Not Estab. %   Immature Grans (Abs) 0.0 0.0 - 0.1 x10E3/uL  Uric acid     Status: Abnormal   Collection Time: 09/30/22 11:59 AM  Result Value Ref Range   Uric Acid 2.2 (L) 3.0 - 7.2 mg/dL    Comment:            Therapeutic target for gout patients: <6.0      Assessment & Plan:  Continue all meds.  Check labs today. Problem List Items Addressed This Visit     Essential hypertension, benign - Primary   Relevant Medications   cloNIDine (CATAPRES) 0.2 MG tablet   metoprolol succinate (TOPROL-XL) 25 MG 24 hr tablet   Other Relevant Orders   CMP14+EGFR (Completed)   Type 2 diabetes mellitus with hyperglycemia, without long-term current use of insulin (HCC)   Relevant Medications   FARXIGA 10 MG TABS tablet   Other Relevant Orders   POCT CBG (Fasting - Glucose) (Completed)   Hemoglobin A1c (Completed)   Idiopathic gout   Relevant Orders   CBC With Differential (Completed)   Uric acid (Completed)   Other specified hypothyroidism   Relevant Medications   levothyroxine (SYNTHROID) 100 MCG tablet   metoprolol succinate (TOPROL-XL) 25 MG 24 hr tablet   Other Relevant Orders   TSH (Completed)   Mixed hyperlipidemia   Relevant Medications   cloNIDine (CATAPRES) 0.2 MG tablet   metoprolol succinate (TOPROL-XL) 25 MG 24 hr  tablet   Other Relevant Orders   Lipid Profile (Completed)    Return in about 3 months (around 12/31/2022).   Total time spent: 25 minutes  Margaretann Loveless, MD  09/30/2022

## 2022-11-22 ENCOUNTER — Other Ambulatory Visit: Payer: Self-pay | Admitting: Internal Medicine

## 2022-12-31 ENCOUNTER — Ambulatory Visit (INDEPENDENT_AMBULATORY_CARE_PROVIDER_SITE_OTHER): Payer: Medicare HMO | Admitting: Internal Medicine

## 2022-12-31 ENCOUNTER — Encounter: Payer: Self-pay | Admitting: Internal Medicine

## 2022-12-31 ENCOUNTER — Other Ambulatory Visit: Payer: Medicare HMO

## 2022-12-31 VITALS — BP 130/76 | HR 102 | Ht 67.0 in | Wt 169.4 lb

## 2022-12-31 DIAGNOSIS — E782 Mixed hyperlipidemia: Secondary | ICD-10-CM

## 2022-12-31 DIAGNOSIS — E1165 Type 2 diabetes mellitus with hyperglycemia: Secondary | ICD-10-CM

## 2022-12-31 DIAGNOSIS — E038 Other specified hypothyroidism: Secondary | ICD-10-CM

## 2022-12-31 DIAGNOSIS — I10 Essential (primary) hypertension: Secondary | ICD-10-CM

## 2022-12-31 DIAGNOSIS — M1000 Idiopathic gout, unspecified site: Secondary | ICD-10-CM

## 2022-12-31 DIAGNOSIS — Z1231 Encounter for screening mammogram for malignant neoplasm of breast: Secondary | ICD-10-CM | POA: Diagnosis not present

## 2022-12-31 LAB — POCT CBG (FASTING - GLUCOSE)-MANUAL ENTRY: Glucose Fasting, POC: 114 mg/dL — AB (ref 70–99)

## 2022-12-31 NOTE — Progress Notes (Signed)
Established Patient Office Visit  Subjective:  Patient ID: Morgan Ochoa, female    DOB: 01-09-1956  Age: 67 y.o. MRN: [id number]  Chief Complaint  Patient presents with   Follow-up    3 month follow up    Patient comes in for her follow-up today.  Generally feels well and has no new complaints.  However she is stressed because of her family health issues.  She is fasting for blood work.  Also is due for mammogram, will send in the order.    No other concerns at this time.   Past Medical History:  Diagnosis Date   Cardiac murmur    from pt medical record   Chest pain    from pt medical record   DM2 (diabetes mellitus, type 2) (HCC)    from pt medical record   GERD (gastroesophageal reflux disease)    from pt medical record   History of colon polyps    from pt medical record   Hypertension    Onychomycosis    from pt medical record    Past Surgical History:  Procedure Laterality Date   ABDOMINAL HYSTERECTOMY     cholonoscopy  2007, 2015   from pt medical record   OOPHORECTOMY     REDUCTION MAMMAPLASTY Bilateral     Social History   Socioeconomic History   Marital status: Married    Spouse name: Not on file   Number of children: Not on file   Years of education: Not on file   Highest education level: Not on file  Occupational History   Not on file  Tobacco Use   Smoking status: Never   Smokeless tobacco: Never  Substance and Sexual Activity   Alcohol use: No   Drug use: No   Sexual activity: Not on file  Other Topics Concern   Not on file  Social History Narrative   Not on file   Social Determinants of Health   Financial Resource Strain: Low Risk  (03/13/2017)   Received from [facility], [facility]   Overall Financial Resource Strain (CARDIA)    Difficulty of Paying Living Expenses: Not hard at all  Food Insecurity: No Food Insecurity (03/13/2017)   Received from [facility], [facility]   Hunger Vital Sign    Worried About Running Out of Food in the Last Year: Never true    Ran Out of Food in the Last Year: Never true  Transportation Needs: No Transportation Needs (03/13/2017)   Received from [facility], [facility]   PRAPARE - Transportation    Lack of Transportation (Medical): No    Lack of Transportation (Non-Medical): No  Physical Activity: Not on file  Stress: Not on file  Social Connections: Not on file  Intimate Partner Violence: Not on file    Family History  Problem Relation Age of Onset   Hypertension Mother     Allergies  Allergen Reactions   Ace Inhibitors     Review of Systems  Constitutional: Negative.   HENT: Negative.    Eyes: Negative.   Respiratory: Negative.  Negative for cough and shortness of breath.   Cardiovascular: Negative.  Negative for chest pain, palpitations and leg swelling.  Gastrointestinal: Negative.  Negative for abdominal pain, constipation, diarrhea, heartburn, nausea and vomiting.  Genitourinary: Negative.  Negative for dysuria and flank pain.  Musculoskeletal: Negative.  Negative for joint pain and myalgias.  Skin:  Negative.   Neurological: Negative.  Negative for dizziness and headaches.  Endo/Heme/Allergies: Negative.   Psychiatric/Behavioral: Negative.  Negative for depression and suicidal ideas. The patient is not nervous/anxious.        Objective:   BP 130/76   Pulse (!) 102   Ht 5\' 7"  (1.702 m)   Wt 169 lb 6.4 oz (76.8 kg)   SpO2 97%   BMI 26.53 kg/m   Vitals:   12/31/22 1056  BP: 130/76  Pulse: (!) 102  Height: 5\' 7"  (1.702 m)  Weight: 169 lb 6.4 oz (76.8 kg)  SpO2: 97%  BMI (Calculated): 26.53    Physical Exam Vitals and nursing note reviewed.  Constitutional:      Appearance: Normal appearance.  HENT:     Head: Normocephalic and atraumatic.     Nose: Nose normal.     Mouth/Throat:     Mouth: Mucous membranes are moist.      Pharynx: Oropharynx is clear.  Eyes:     Conjunctiva/sclera: Conjunctivae normal.     Pupils: Pupils are equal, round, and reactive to light.  Cardiovascular:     Rate and Rhythm: Normal rate and regular rhythm.     Pulses: Normal pulses.     Heart sounds: Normal heart sounds. No murmur heard. Pulmonary:     Effort: Pulmonary effort is normal.     Breath sounds: Normal breath sounds. No wheezing.  Abdominal:     General: Bowel sounds are normal.     Palpations: Abdomen is soft.     Tenderness: There is no abdominal tenderness. There is no right CVA tenderness or left CVA tenderness.  Musculoskeletal:        General: Normal range of motion.     Cervical back: Normal range of motion.     Right lower leg: No edema.     Left lower leg: No edema.  Skin:    General: Skin is warm and dry.  Neurological:     General: No focal deficit present.     Mental Status: She is alert and oriented to person, place, and time.  Psychiatric:        Mood and Affect: Mood normal.        Behavior: Behavior normal.      Results for orders placed or performed in visit on 12/31/22  POCT CBG (Fasting - Glucose)  Result Value Ref Range   Glucose Fasting, POC 114 (A) 70 - 99 mg/dL    Recent Results (from the past 2160 hour(s))  POCT CBG (Fasting - Glucose)     Status: Abnormal   Collection Time: 12/31/22 11:00 AM  Result Value Ref Range   Glucose Fasting, POC 114 (A) 70 - 99 mg/dL      Assessment & Plan:  Advised to continue taking all her medications regularly.  Try to get some exercise in.  Check labs today. Problem List Items Addressed This Visit     Essential hypertension, benign   Relevant Orders   CMP14+EGFR   CBC with Diff   Type 2 diabetes mellitus with hyperglycemia, without long-term current use of insulin (HCC) - Primary   Relevant Orders   POCT CBG (Fasting - Glucose) (Completed)   Hemoglobin A1c   Idiopathic gout   Relevant Orders   Uric acid   Other specified  hypothyroidism   Relevant Orders   TSH+T4F+T3Free   Mixed hyperlipidemia   Relevant Orders   Lipid Panel w/o Chol/HDL Ratio   Other Visit Diagnoses  Breast cancer screening by mammogram       Relevant Orders   MM 3D SCREENING MAMMOGRAM BILATERAL BREAST       Return in about 4 months (around 05/02/2023).   Total time spent: 30 minutes  Margaretann Loveless, MD  12/31/2022   This document may have been prepared by [facility] Voice Recognition software and as such may include unintentional dictation errors.

## 2023-01-06 NOTE — Progress Notes (Signed)
 Spoke pt DOB verified and verbalized understanding.

## 2023-02-12 ENCOUNTER — Encounter: Payer: Self-pay | Admitting: Internal Medicine

## 2023-02-18 NOTE — Progress Notes (Signed)
Patient notified

## 2023-02-20 ENCOUNTER — Ambulatory Visit
Admission: RE | Admit: 2023-02-20 | Discharge: 2023-02-20 | Disposition: A | Discharge status: Home / Self Care | Payer: Medicare HMO | Source: Ambulatory Visit | Attending: Internal Medicine | Admitting: Internal Medicine

## 2023-02-20 DIAGNOSIS — Z1231 Encounter for screening mammogram for malignant neoplasm of breast: Secondary | ICD-10-CM | POA: Insufficient documentation

## 2023-02-22 ENCOUNTER — Other Ambulatory Visit: Payer: Self-pay | Admitting: Internal Medicine

## 2023-02-22 DIAGNOSIS — M1000 Idiopathic gout, unspecified site: Secondary | ICD-10-CM

## 2023-03-26 ENCOUNTER — Telehealth: Payer: Self-pay | Admitting: Internal Medicine

## 2023-03-26 NOTE — Telephone Encounter (Signed)
Patient left VM requesting samples of Farxiga.

## 2023-04-29 ENCOUNTER — Ambulatory Visit (INDEPENDENT_AMBULATORY_CARE_PROVIDER_SITE_OTHER): Payer: Medicare HMO | Admitting: Internal Medicine

## 2023-04-29 ENCOUNTER — Encounter: Payer: Self-pay | Admitting: Internal Medicine

## 2023-04-29 VITALS — BP 135/78 | HR 83 | Ht 66.0 in | Wt 168.4 lb

## 2023-04-29 DIAGNOSIS — E1159 Type 2 diabetes mellitus with other circulatory complications: Secondary | ICD-10-CM | POA: Diagnosis not present

## 2023-04-29 DIAGNOSIS — E038 Other specified hypothyroidism: Secondary | ICD-10-CM

## 2023-04-29 DIAGNOSIS — E782 Mixed hyperlipidemia: Secondary | ICD-10-CM

## 2023-04-29 DIAGNOSIS — M1000 Idiopathic gout, unspecified site: Secondary | ICD-10-CM

## 2023-04-29 DIAGNOSIS — E1165 Type 2 diabetes mellitus with hyperglycemia: Secondary | ICD-10-CM | POA: Diagnosis not present

## 2023-04-29 DIAGNOSIS — Z13820 Encounter for screening for osteoporosis: Secondary | ICD-10-CM | POA: Diagnosis not present

## 2023-04-29 DIAGNOSIS — R300 Dysuria: Secondary | ICD-10-CM

## 2023-04-29 DIAGNOSIS — I152 Hypertension secondary to endocrine disorders: Secondary | ICD-10-CM

## 2023-04-29 DIAGNOSIS — E1169 Type 2 diabetes mellitus with other specified complication: Secondary | ICD-10-CM

## 2023-04-29 LAB — POCT URINALYSIS DIPSTICK
Bilirubin, UA: NEGATIVE
Blood, UA: NEGATIVE
Glucose, UA: POSITIVE — AB
Ketones, UA: NEGATIVE
Leukocytes, UA: NEGATIVE
Nitrite, UA: POSITIVE
Protein, UA: NEGATIVE
Spec Grav, UA: 1.01 (ref 1.010–1.025)
Urobilinogen, UA: 0.2 U/dL
pH, UA: 5.5 (ref 5.0–8.0)

## 2023-04-29 LAB — POC CREATINE & ALBUMIN,URINE
Albumin/Creatinine Ratio, Urine, POC: <30
Creatinine, POC: 200 mg/dL
Microalbumin Ur, POC: 30 mg/L

## 2023-04-29 LAB — GLUCOSE, POCT (MANUAL RESULT ENTRY): POC Glucose: 105 mg/dL — AB (ref 70–99)

## 2023-04-29 NOTE — Progress Notes (Signed)
Established Patient Office Visit  Subjective:  Patient ID: Morgan Ochoa, female    DOB: [birth year]  Age: 67 y.o. MRN: [id number]  Chief Complaint  Patient presents with   Follow-up    4 month     Patient comes in for her follow-up today.  She is fasting for blood work.  She is up-to-date on her mammogram, colon cancer screening, immunization and diabetic eye exam. Will schedule her for DEXA.  And urine microalbumin today. She mentions that intermittently she feels some discomfort in both her groin areas.  No tenderness, no constipation but may be mild dysuria.  Her urine dipstick in the office is nitrite positive, no leukocytes.  Will send to lab for culture.    No other concerns at this time.   Past Medical History:  Diagnosis Date   Cardiac murmur    from pt medical record   Chest pain    from pt medical record   DM2 (diabetes mellitus, type 2) (HCC)    from pt medical record   GERD (gastroesophageal reflux disease)    from pt medical record   History of colon polyps    from pt medical record   Hypertension    Onychomycosis    from pt medical record    Past Surgical History:  Procedure Laterality Date   ABDOMINAL HYSTERECTOMY     cholonoscopy  2007, 2015   from pt medical record   OOPHORECTOMY     REDUCTION MAMMAPLASTY Bilateral     Social History   Socioeconomic History   Marital status: Married    Spouse name: Not on file   Number of children: Not on file   Years of education: Not on file   Highest education level: Not on file  Occupational History   Not on file  Tobacco Use   Smoking status: Never   Smokeless tobacco: Never  Substance and Sexual Activity   Alcohol use: No   Drug use: No   Sexual activity: Not on file  Other Topics Concern   Not on file  Social History Narrative   Not on file   Social Determinants of Health   Financial Resource Strain: Low Risk  (03/13/2017)   Received from [facility], [facility]   Overall Financial Resource Strain (CARDIA)    Difficulty of Paying Living Expenses: Not hard at all  Food Insecurity: No Food Insecurity (03/13/2017)   Received from [facility], [facility]   Hunger Vital Sign    Worried About Running Out of Food in the Last Year: Never true    Ran Out of Food in the Last Year: Never true  Transportation Needs: No Transportation Needs (03/13/2017)   Received from [facility], [facility]   PRAPARE - Transportation    Lack of Transportation (Medical): No    Lack of Transportation (Non-Medical): No  Physical Activity: Not on file  Stress: Not on file  Social Connections: Not on file  Intimate Partner Violence: Not on file    Family History  Problem Relation Age of Onset   Hypertension Mother    Breast cancer Neg Hx     Allergies  Allergen Reactions   Ace Inhibitors     Outpatient Medications Prior to Visit  Medication Sig   allopurinol (ZYLOPRIM) 100 MG tablet TAKE 1 TABLET BY MOUTH TWICE A DAY   amLODipine (NORVASC) 10 MG tablet Take 10 mg by  mouth daily.   aspirin 81 MG chewable tablet Chew 81 mg by mouth daily.   FARXIGA 10 MG TABS tablet Take 10 mg by mouth daily.   levothyroxine (SYNTHROID) 100 MCG tablet Take 100 mcg by mouth daily.   metFORMIN (GLUCOPHAGE) 500 MG tablet Take 500 mg by mouth 2 (two) times daily with a meal.   metoprolol succinate (TOPROL-XL) 25 MG 24 hr tablet Take 1 tablet (25 mg total) by mouth daily.   rosuvastatin (CRESTOR) 10 MG tablet TAKE 1 TABLET BY MOUTH EVERY DAY   baclofen (LIORESAL) 10 MG tablet Take 1 tablet by mouth at bedtime as needed. (Patient not taking: Reported on 12/31/2022)   cloNIDine (CATAPRES) 0.2 MG tablet Take 0.2 mg by mouth at bedtime. (Patient not taking: Reported on 12/31/2022)   lisinopril-hydrochlorothiazide (PRINZIDE,ZESTORETIC) 20-25 MG tablet Take 1 tablet by mouth daily. (Patient not taking:  Reported on 12/31/2022)   meloxicam (MOBIC) 15 MG tablet TAKE 1 TABLET (15 MG TOTAL) BY MOUTH DAILY. (Patient not taking: Reported on 12/31/2022)   montelukast (SINGULAIR) 10 MG tablet Take 1 tablet by mouth daily. (Patient not taking: Reported on 12/31/2022)   No facility-administered medications prior to visit.    Review of Systems  Constitutional: Negative.  Negative for chills, fever, malaise/fatigue and weight loss.  HENT: Negative.  Negative for congestion, nosebleeds and sinus pain.   Eyes: Negative.   Respiratory: Negative.  Negative for cough, shortness of breath and stridor.   Cardiovascular: Negative.  Negative for chest pain, palpitations and leg swelling.  Gastrointestinal: Negative.  Negative for abdominal pain, constipation, diarrhea, heartburn, nausea and vomiting.  Genitourinary: Negative.  Negative for dysuria and flank pain.  Musculoskeletal: Negative.  Negative for joint pain and myalgias.  Skin: Negative.   Neurological: Negative.  Negative for dizziness, tingling, tremors, sensory change, speech change and headaches.  Endo/Heme/Allergies: Negative.   Psychiatric/Behavioral: Negative.  Negative for depression and suicidal ideas. The patient is not nervous/anxious.        Objective:   BP 135/78   Pulse 83   Ht 5\' 6"  (1.676 m)   Wt 168 lb 6.4 oz (76.4 kg)   SpO2 97%   BMI 27.18 kg/m   Vitals:   04/29/23 1049  BP: 135/78  Pulse: 83  Height: 5\' 6"  (1.676 m)  Weight: 168 lb 6.4 oz (76.4 kg)  SpO2: 97%  BMI (Calculated): 27.19    Physical Exam Vitals and nursing note reviewed.  Constitutional:      Appearance: Normal appearance.  HENT:     Head: Normocephalic and atraumatic.     Nose: Nose normal.     Mouth/Throat:     Mouth: Mucous membranes are moist.     Pharynx: Oropharynx is clear.  Eyes:     Conjunctiva/sclera: Conjunctivae normal.     Pupils: Pupils are equal, round, and reactive to light.  Cardiovascular:     Rate and Rhythm: Normal rate  and regular rhythm.     Pulses: Normal pulses.     Heart sounds: Normal heart sounds. No murmur heard. Pulmonary:     Effort: Pulmonary effort is normal.     Breath sounds: Normal breath sounds. No wheezing.  Abdominal:     General: Bowel sounds are normal.     Palpations: Abdomen is soft.     Tenderness: There is no abdominal tenderness. There is no right CVA tenderness or left CVA tenderness.  Musculoskeletal:        General: Normal range of motion.  Cervical back: Normal range of motion.     Right lower leg: No edema.     Left lower leg: No edema.  Skin:    General: Skin is warm and dry.  Neurological:     General: No focal deficit present.     Mental Status: She is alert and oriented to person, place, and time.  Psychiatric:        Mood and Affect: Mood normal.        Behavior: Behavior normal.      Results for orders placed or performed in visit on 04/29/23  POCT Glucose (CBG)  Result Value Ref Range   POC Glucose 105 (A) 70 - 99 mg/dl  POCT Urinalysis Dipstick (81002)  Result Value Ref Range   Color, UA yellow    Clarity, UA slightly cloudy    Glucose, UA Positive (A) Negative   Bilirubin, UA negative    Ketones, UA negative    Spec Grav, UA 1.010 1.010 - 1.025   Blood, UA negative    pH, UA 5.5 5.0 - 8.0   Protein, UA Negative Negative   Urobilinogen, UA 0.2 0.2 or 1.0 E.U./dL   Nitrite, UA positive    Leukocytes, UA Negative Negative   Appearance     Odor    POC CREATINE & ALBUMIN,URINE  Result Value Ref Range   Microalbumin Ur, POC 30 mg/L   Creatinine, POC 200 mg/dL   Albumin/Creatinine Ratio, Urine, POC <30     Recent Results (from the past 2160 hour(s))  POCT Glucose (CBG)     Status: Abnormal   Collection Time: 04/29/23 10:54 AM  Result Value Ref Range   POC Glucose 105 (A) 70 - 99 mg/dl  POCT Urinalysis Dipstick (16109)     Status: Abnormal   Collection Time: 04/29/23 11:19 AM  Result Value Ref Range   Color, UA yellow    Clarity, UA  slightly cloudy    Glucose, UA Positive (A) Negative   Bilirubin, UA negative    Ketones, UA negative    Spec Grav, UA 1.010 1.010 - 1.025   Blood, UA negative    pH, UA 5.5 5.0 - 8.0   Protein, UA Negative Negative   Urobilinogen, UA 0.2 0.2 or 1.0 E.U./dL   Nitrite, UA positive    Leukocytes, UA Negative Negative   Appearance     Odor    POC CREATINE & ALBUMIN,URINE     Status: Normal   Collection Time: 04/29/23 11:19 AM  Result Value Ref Range   Microalbumin Ur, POC 30 mg/L   Creatinine, POC 200 mg/dL   Albumin/Creatinine Ratio, Urine, POC <30       Assessment & Plan:  Continue medications.  Check labs and urine culture.  Schedule DEXA.  Will call her with results. Problem List Items Addressed This Visit     Type 2 diabetes mellitus with hyperglycemia, without long-term current use of insulin (HCC)   Relevant Orders   POCT Glucose (CBG) (Completed)   Hemoglobin A1c   POCT Urinalysis Dipstick (81002) (Completed)   POC CREATINE & ALBUMIN,URINE (Completed)   Idiopathic gout   Relevant Orders   Uric acid   CBC with Diff   Other specified hypothyroidism   Relevant Orders   TSH   Hypertension associated with diabetes (HCC) - Primary   Relevant Orders   CMP14+EGFR   Other Visit Diagnoses     Screening for osteoporosis       Relevant Orders   DG  Bone Density   Combined hyperlipidemia associated with type 2 diabetes mellitus (HCC)       Relevant Orders   Lipid Panel w/o Chol/HDL Ratio   Dysuria       Relevant Orders   Urine Culture       Return in about 4 months (around 08/28/2023).   Total time spent: 30 minutes  Margaretann Loveless, MD  04/29/2023   This document may have been prepared by [facility] Voice Recognition software and as such may include unintentional dictation errors.

## 2023-04-30 LAB — CBC WITH DIFFERENTIAL/PLATELET
Basophils Absolute: 0.1 10*3/uL (ref 0.0–0.2)
Basos: 1 %
EOS (ABSOLUTE): 0.4 10*3/uL (ref 0.0–0.4)
Eos: 4 %
Hematocrit: 40.3 % (ref 34.0–46.6)
Hemoglobin: 12.5 g/dL (ref 11.1–15.9)
Immature Grans (Abs): 0 10*3/uL (ref 0.0–0.1)
Immature Granulocytes: 0 %
Lymphocytes Absolute: 2.4 10*3/uL (ref 0.7–3.1)
Lymphs: 29 %
MCH: 25.5 pg — ABNORMAL LOW (ref 26.6–33.0)
MCHC: 31 g/dL — ABNORMAL LOW (ref 31.5–35.7)
MCV: 82 fL (ref 79–97)
Monocytes Absolute: 0.6 10*3/uL (ref 0.1–0.9)
Monocytes: 7 %
Neutrophils Absolute: 4.8 10*3/uL (ref 1.4–7.0)
Neutrophils: 59 %
Platelets: 361 10*3/uL (ref 150–450)
RBC: 4.91 x10E6/uL (ref 3.77–5.28)
RDW: 13.9 % (ref 11.7–15.4)
WBC: 8.3 10*3/uL (ref 3.4–10.8)

## 2023-04-30 LAB — LIPID PANEL W/O CHOL/HDL RATIO
Cholesterol, Total: 72 mg/dL — ABNORMAL LOW (ref 100–199)
HDL: 24 mg/dL — ABNORMAL LOW (ref >39)
LDL Chol Calc (NIH): 31 mg/dL (ref 0–99)
Triglycerides: 78 mg/dL (ref 0–149)
VLDL Cholesterol Cal: 17 mg/dL (ref 5–40)

## 2023-04-30 LAB — CMP14+EGFR
ALT: 23 [IU]/L (ref 0–32)
AST: 22 [IU]/L (ref 0–40)
Albumin: 4.4 g/dL (ref 3.9–4.9)
Alkaline Phosphatase: 58 [IU]/L (ref 44–121)
BUN/Creatinine Ratio: 14 (ref 12–28)
BUN: 9 mg/dL (ref 8–27)
Bilirubin Total: 0.4 mg/dL (ref 0.0–1.2)
CO2: 25 mmol/L (ref 20–29)
Calcium: 9.8 mg/dL (ref 8.7–10.3)
Chloride: 104 mmol/L (ref 96–106)
Creatinine, Ser: 0.63 mg/dL (ref 0.57–1.00)
Globulin, Total: 3.2 g/dL (ref 1.5–4.5)
Glucose: 108 mg/dL — ABNORMAL HIGH (ref 70–99)
Potassium: 4.3 mmol/L (ref 3.5–5.2)
Sodium: 145 mmol/L — ABNORMAL HIGH (ref 134–144)
Total Protein: 7.6 g/dL (ref 6.0–8.5)
eGFR: 96 mL/min/{1.73_m2} (ref >59)

## 2023-04-30 LAB — URIC ACID: Uric Acid: 3.3 mg/dL (ref 3.0–7.2)

## 2023-04-30 LAB — TSH: TSH: 0.27 u[IU]/mL — ABNORMAL LOW (ref 0.450–4.500)

## 2023-04-30 LAB — HEMOGLOBIN A1C
Est. average glucose Bld gHb Est-mCnc: 160 mg/dL
Hgb A1c MFr Bld: 7.2 % — ABNORMAL HIGH (ref 4.8–5.6)

## 2023-05-02 ENCOUNTER — Ambulatory Visit: Payer: Medicare HMO | Admitting: Internal Medicine

## 2023-05-03 LAB — URINE CULTURE

## 2023-05-25 ENCOUNTER — Other Ambulatory Visit: Payer: Self-pay | Admitting: Internal Medicine

## 2023-05-25 DIAGNOSIS — E038 Other specified hypothyroidism: Secondary | ICD-10-CM

## 2023-05-25 DIAGNOSIS — E1165 Type 2 diabetes mellitus with hyperglycemia: Secondary | ICD-10-CM

## 2023-05-25 DIAGNOSIS — I152 Hypertension secondary to endocrine disorders: Secondary | ICD-10-CM

## 2023-05-27 ENCOUNTER — Other Ambulatory Visit: Payer: 59

## 2023-05-28 ENCOUNTER — Other Ambulatory Visit: Payer: Medicare HMO

## 2023-06-11 ENCOUNTER — Other Ambulatory Visit: Payer: 59

## 2023-06-12 ENCOUNTER — Other Ambulatory Visit: Payer: Self-pay | Admitting: Internal Medicine

## 2023-06-12 DIAGNOSIS — E782 Mixed hyperlipidemia: Secondary | ICD-10-CM

## 2023-06-18 ENCOUNTER — Ambulatory Visit (INDEPENDENT_AMBULATORY_CARE_PROVIDER_SITE_OTHER): Payer: 59

## 2023-06-18 ENCOUNTER — Other Ambulatory Visit: Payer: 59

## 2023-06-18 DIAGNOSIS — Z13820 Encounter for screening for osteoporosis: Secondary | ICD-10-CM | POA: Diagnosis not present

## 2023-06-18 DIAGNOSIS — E038 Other specified hypothyroidism: Secondary | ICD-10-CM

## 2023-06-18 DIAGNOSIS — M8589 Other specified disorders of bone density and structure, multiple sites: Secondary | ICD-10-CM

## 2023-06-19 ENCOUNTER — Other Ambulatory Visit: Payer: Self-pay | Admitting: Internal Medicine

## 2023-06-19 DIAGNOSIS — E038 Other specified hypothyroidism: Secondary | ICD-10-CM

## 2023-06-19 DIAGNOSIS — Z9189 Other specified personal risk factors, not elsewhere classified: Secondary | ICD-10-CM

## 2023-06-19 LAB — TSH+T4F+T3FREE
Free T4: 1.78 ng/dL — ABNORMAL HIGH (ref 0.82–1.77)
T3, Free: 3.5 pg/mL (ref 2.0–4.4)
TSH: 0.05 u[IU]/mL — ABNORMAL LOW (ref 0.450–4.500)

## 2023-06-19 MED ORDER — IBANDRONATE SODIUM 150 MG PO TABS: 150.0000 mg | ORAL_TABLET | ORAL | 3 refills | Status: DC

## 2023-06-19 MED ORDER — LEVOTHYROXINE SODIUM 88 MCG PO TABS: 88.0000 ug | ORAL_TABLET | Freq: Every day | ORAL | 11 refills | Status: DC

## 2023-06-19 NOTE — Progress Notes (Signed)
Patient notified

## 2023-08-28 ENCOUNTER — Ambulatory Visit (INDEPENDENT_AMBULATORY_CARE_PROVIDER_SITE_OTHER): Payer: 59 | Admitting: Internal Medicine

## 2023-08-28 ENCOUNTER — Encounter: Payer: Self-pay | Admitting: Internal Medicine

## 2023-08-28 ENCOUNTER — Other Ambulatory Visit

## 2023-08-28 VITALS — BP 140/84 | HR 83 | Ht 66.0 in | Wt 168.8 lb

## 2023-08-28 DIAGNOSIS — E782 Mixed hyperlipidemia: Secondary | ICD-10-CM | POA: Diagnosis not present

## 2023-08-28 DIAGNOSIS — E038 Other specified hypothyroidism: Secondary | ICD-10-CM

## 2023-08-28 DIAGNOSIS — E1165 Type 2 diabetes mellitus with hyperglycemia: Secondary | ICD-10-CM

## 2023-08-28 DIAGNOSIS — Z1329 Encounter for screening for other suspected endocrine disorder: Secondary | ICD-10-CM

## 2023-08-28 DIAGNOSIS — I152 Hypertension secondary to endocrine disorders: Secondary | ICD-10-CM

## 2023-08-28 DIAGNOSIS — K219 Gastro-esophageal reflux disease without esophagitis: Secondary | ICD-10-CM | POA: Insufficient documentation

## 2023-08-28 DIAGNOSIS — I10 Essential (primary) hypertension: Secondary | ICD-10-CM

## 2023-08-28 DIAGNOSIS — E1169 Type 2 diabetes mellitus with other specified complication: Secondary | ICD-10-CM

## 2023-08-28 DIAGNOSIS — F418 Other specified anxiety disorders: Secondary | ICD-10-CM | POA: Insufficient documentation

## 2023-08-28 DIAGNOSIS — R197 Diarrhea, unspecified: Secondary | ICD-10-CM

## 2023-08-28 DIAGNOSIS — Z9189 Other specified personal risk factors, not elsewhere classified: Secondary | ICD-10-CM

## 2023-08-28 DIAGNOSIS — E1159 Type 2 diabetes mellitus with other circulatory complications: Secondary | ICD-10-CM | POA: Diagnosis not present

## 2023-08-28 DIAGNOSIS — N951 Menopausal and female climacteric states: Secondary | ICD-10-CM

## 2023-08-28 LAB — POCT CBG (FASTING - GLUCOSE)-MANUAL ENTRY: Glucose Fasting, POC: 114 mg/dL — AB (ref 70–99)

## 2023-08-28 MED ORDER — ALPRAZOLAM 0.25 MG PO TABS: 0.2500 mg | ORAL_TABLET | Freq: Every evening | ORAL | 0 refills | Status: DC | PRN

## 2023-08-28 MED ORDER — CLONIDINE HCL 0.2 MG PO TABS: 0.2000 mg | ORAL_TABLET | Freq: Every day | ORAL | 3 refills | Status: DC

## 2023-08-28 MED ORDER — CIPROFLOXACIN HCL 500 MG PO TABS: 500.0000 mg | ORAL_TABLET | Freq: Two times a day (BID) | ORAL | 0 refills | Status: AC

## 2023-08-28 MED ORDER — IBANDRONATE SODIUM 150 MG PO TABS
150.0000 mg | ORAL_TABLET | ORAL | 3 refills | Status: DC
Start: 2023-08-28 — End: 2024-01-13

## 2023-08-28 MED ORDER — PANTOPRAZOLE SODIUM 40 MG PO TBEC: 40.0000 mg | DELAYED_RELEASE_TABLET | Freq: Every day | ORAL | 1 refills | Status: DC

## 2023-08-28 NOTE — Progress Notes (Signed)
 Established Patient Office Visit  Subjective:  Patient ID: Morgan Ochoa, female    DOB: 08-Nov-1955  Age: 68 y.o. MRN: [id number]  Chief Complaint  Patient presents with   Follow-up    4 month follow up    Patient comes in for her follow-up today.  She reports of return of hot flashes and night sweats.  She used to be on clonidine tablets at bedtime, will resume.  Is also mentioning some symptoms suggestive of acid reflux, will add pantoprazole. Patient is traveling soon and will need prescription for Xanax for anxiety and Cipro for possible traveler's diarrhea. Patient is due for blood work today.    No other concerns at this time.   Past Medical History:  Diagnosis Date   Cardiac murmur    from pt medical record   Chest pain    from pt medical record   DM2 (diabetes mellitus, type 2) (HCC)    from pt medical record   GERD (gastroesophageal reflux disease)    from pt medical record   History of colon polyps    from pt medical record   Hypertension    Onychomycosis    from pt medical record    Past Surgical History:  Procedure Laterality Date   ABDOMINAL HYSTERECTOMY     cholonoscopy  2007, 2015   from pt medical record   OOPHORECTOMY     REDUCTION MAMMAPLASTY Bilateral     Social History   Socioeconomic History   Marital status: Married    Spouse name: Not on file   Number of children: Not on file   Years of education: Not on file   Highest education level: Not on file  Occupational History   Not on file  Tobacco Use   Smoking status: Never   Smokeless tobacco: Never  Substance and Sexual Activity   Alcohol use: No   Drug use: No   Sexual activity: Not on file  Other Topics Concern   Not on file  Social History Narrative   Not on file   Social Drivers of Health   Financial Resource Strain: Low Risk  (03/13/2017)   Received from [facility], [facility]   Overall Financial Resource Strain (CARDIA)     Difficulty of Paying Living Expenses: Not hard at all  Food Insecurity: No Food Insecurity (03/13/2017)   Received from [facility], [facility]   Hunger Vital Sign    Worried About Running Out of Food in the Last Year: Never true    Ran Out of Food in the Last Year: Never true  Transportation Needs: No Transportation Needs (03/13/2017)   Received from [facility], [facility]   PRAPARE - Transportation    Lack of Transportation (Medical): No    Lack of Transportation (Non-Medical): No  Physical Activity: Not on file  Stress: Not on file  Social Connections: Not on file  Intimate Partner Violence: Not on file    Family History  Problem Relation Age of Onset   Hypertension Mother    Breast cancer Neg Hx     Allergies  Allergen Reactions   Ace Inhibitors     Outpatient Medications Prior to Visit  Medication Sig   allopurinol (ZYLOPRIM) 100 MG tablet TAKE 1 TABLET BY MOUTH TWICE A DAY   amLODipine (NORVASC) 10 MG tablet TAKE 1 TABLET BY MOUTH EVERY DAY   FARXIGA 10 MG TABS tablet Take 10  mg by mouth daily.   levothyroxine (SYNTHROID) 88 MCG tablet Take 1 tablet (88 mcg total) by mouth daily.   metFORMIN (GLUCOPHAGE) 500 MG tablet TAKE 1 TABLET BY MOUTH TWICE A DAY   metoprolol succinate (TOPROL-XL) 25 MG 24 hr tablet Take 1 tablet (25 mg total) by mouth daily.   rosuvastatin (CRESTOR) 10 MG tablet TAKE 1 TABLET BY MOUTH EVERY DAY   aspirin 81 MG chewable tablet Chew 81 mg by mouth daily. (Patient not taking: Reported on 08/28/2023)   [DISCONTINUED] baclofen (LIORESAL) 10 MG tablet Take 1 tablet by mouth at bedtime as needed. (Patient not taking: Reported on 08/28/2023)   [DISCONTINUED] cloNIDine (CATAPRES) 0.2 MG tablet Take 0.2 mg by mouth at bedtime. (Patient not taking: Reported on 08/28/2023)   [DISCONTINUED] ibandronate (BONIVA) 150 MG tablet Take 1 tablet (150 mg total) by mouth every 30 (thirty) days.  Take in the morning with a full glass of water, on an empty stomach, and do not take anything else by mouth or lie down for the next 30 min. (Patient not taking: Reported on 08/28/2023)   [DISCONTINUED] lisinopril-hydrochlorothiazide (PRINZIDE,ZESTORETIC) 20-25 MG tablet Take 1 tablet by mouth daily. (Patient not taking: Reported on 08/28/2023)   [DISCONTINUED] meloxicam (MOBIC) 15 MG tablet TAKE 1 TABLET (15 MG TOTAL) BY MOUTH DAILY. (Patient not taking: Reported on 08/28/2023)   [DISCONTINUED] montelukast (SINGULAIR) 10 MG tablet Take 1 tablet by mouth daily. (Patient not taking: Reported on 08/28/2023)   No facility-administered medications prior to visit.    Review of Systems  Constitutional: Negative.  Negative for chills, fever, malaise/fatigue and weight loss.  HENT: Negative.  Negative for ear discharge and sore throat.   Eyes: Negative.   Respiratory: Negative.  Negative for cough and shortness of breath.   Cardiovascular: Negative.  Negative for chest pain, palpitations and leg swelling.  Gastrointestinal: Negative.  Negative for abdominal pain, constipation, diarrhea, heartburn, nausea and vomiting.  Genitourinary: Negative.  Negative for dysuria and flank pain.  Musculoskeletal: Negative.  Negative for joint pain and myalgias.  Skin: Negative.   Neurological: Negative.  Negative for dizziness, tingling, tremors, sensory change, speech change and headaches.  Endo/Heme/Allergies: Negative.   Psychiatric/Behavioral: Negative.  Negative for depression and suicidal ideas. The patient is not nervous/anxious.        Objective:   BP (!) 140/84   Pulse 83   Ht 5\' 6"  (1.676 m)   Wt 168 lb 12.8 oz (76.6 kg)   SpO2 98%   BMI 27.25 kg/m   Vitals:   08/28/23 1048  BP: (!) 140/84  Pulse: 83  Height: 5\' 6"  (1.676 m)  Weight: 168 lb 12.8 oz (76.6 kg)  SpO2: 98%  BMI (Calculated): 27.26    Physical Exam Vitals and nursing note reviewed.  Constitutional:      Appearance: Normal  appearance.  HENT:     Head: Normocephalic and atraumatic.     Nose: Nose normal.     Mouth/Throat:     Mouth: Mucous membranes are moist.     Pharynx: Oropharynx is clear.  Eyes:     Conjunctiva/sclera: Conjunctivae normal.     Pupils: Pupils are equal, round, and reactive to light.  Cardiovascular:     Rate and Rhythm: Normal rate and regular rhythm.     Pulses: Normal pulses.     Heart sounds: Normal heart sounds. No murmur heard. Pulmonary:     Effort: Pulmonary effort is normal.     Breath sounds: Normal breath sounds.  No wheezing.  Abdominal:     General: Bowel sounds are normal.     Palpations: Abdomen is soft.     Tenderness: There is no abdominal tenderness. There is no right CVA tenderness or left CVA tenderness.  Musculoskeletal:        General: Normal range of motion.     Cervical back: Normal range of motion.     Right lower leg: No edema.     Left lower leg: No edema.  Skin:    General: Skin is warm and dry.  Neurological:     General: No focal deficit present.     Mental Status: She is alert and oriented to person, place, and time.  Psychiatric:        Mood and Affect: Mood normal.        Behavior: Behavior normal.      Results for orders placed or performed in visit on 08/28/23  POCT CBG (Fasting - Glucose)  Result Value Ref Range   Glucose Fasting, POC 114 (A) 70 - 99 mg/dL    Recent Results (from the past 2160 hours)  TSH+T4F+T3Free     Status: Abnormal   Collection Time: 06/18/23 11:31 AM  Result Value Ref Range   TSH 0.050 (L) 0.450 - 4.500 uIU/mL   T3, Free 3.5 2.0 - 4.4 pg/mL   Free T4 1.78 (H) 0.82 - 1.77 ng/dL  POCT CBG (Fasting - Glucose)     Status: Abnormal   Collection Time: 08/28/23 10:54 AM  Result Value Ref Range   Glucose Fasting, POC 114 (A) 70 - 99 mg/dL      Assessment & Plan:  Check labs.  Continue medications as prescribed.  Follow-up in 3 weeks. Problem List Items Addressed This Visit     Type 2 diabetes mellitus with  hyperglycemia, without long-term current use of insulin (HCC)   Relevant Orders   POCT CBG (Fasting - Glucose) (Completed)   Hypertension associated with diabetes (HCC) - Primary   Relevant Medications   cloNIDine (CATAPRES) 0.2 MG tablet   Combined hyperlipidemia associated with type 2 diabetes mellitus (HCC)   Relevant Medications   cloNIDine (CATAPRES) 0.2 MG tablet   Situational anxiety   Relevant Medications   ALPRAZolam (XANAX) 0.25 MG tablet   Gastroesophageal reflux disease without esophagitis   Relevant Medications   pantoprazole (PROTONIX) 40 MG tablet   Hot flashes due to menopause   Relevant Medications   cloNIDine (CATAPRES) 0.2 MG tablet   Other Visit Diagnoses       At high risk for osteoporosis       Relevant Medications   ibandronate (BONIVA) 150 MG tablet     Diarrhea, unspecified type       Relevant Medications   ciprofloxacin (CIPRO) 500 MG tablet       Return in about 3 weeks (around 09/18/2023).   Total time spent: 30 minutes  Margaretann Loveless, MD  08/28/2023   This document may have been prepared by [facility] Voice Recognition software and as such may include unintentional dictation errors.

## 2023-08-29 LAB — HEMOGLOBIN A1C
Est. average glucose Bld gHb Est-mCnc: 166 mg/dL
Hgb A1c MFr Bld: 7.4 % — ABNORMAL HIGH (ref 4.8–5.6)

## 2023-08-29 LAB — CMP14+EGFR
ALT: 25 IU/L (ref 0–32)
AST: 26 IU/L (ref 0–40)
Albumin: 4.4 g/dL (ref 3.9–4.9)
Alkaline Phosphatase: 77 IU/L (ref 44–121)
BUN/Creatinine Ratio: 22 (ref 12–28)
BUN: 13 mg/dL (ref 8–27)
Bilirubin Total: 0.2 mg/dL (ref 0.0–1.2)
CO2: 23 mmol/L (ref 20–29)
Calcium: 9.6 mg/dL (ref 8.7–10.3)
Chloride: 102 mmol/L (ref 96–106)
Creatinine, Ser: 0.58 mg/dL (ref 0.57–1.00)
Globulin, Total: 3 g/dL (ref 1.5–4.5)
Glucose: 121 mg/dL — ABNORMAL HIGH (ref 70–99)
Potassium: 4.1 mmol/L (ref 3.5–5.2)
Sodium: 142 mmol/L (ref 134–144)
Total Protein: 7.4 g/dL (ref 6.0–8.5)
eGFR: 98 mL/min/1.73

## 2023-08-29 LAB — LIPID PANEL
Chol/HDL Ratio: 3.1 ratio (ref 0.0–4.4)
Cholesterol, Total: 85 mg/dL — ABNORMAL LOW (ref 100–199)
HDL: 27 mg/dL — ABNORMAL LOW (ref >39)
LDL Chol Calc (NIH): 41 mg/dL (ref 0–99)
Triglycerides: 85 mg/dL (ref 0–149)
VLDL Cholesterol Cal: 17 mg/dL (ref 5–40)

## 2023-08-29 LAB — TSH+T4F+T3FREE
Free T4: 1.21 ng/dL (ref 0.82–1.77)
T3, Free: 3 pg/mL (ref 2.0–4.4)
TSH: 1.13 u[IU]/mL (ref 0.450–4.500)

## 2023-09-04 ENCOUNTER — Other Ambulatory Visit: Payer: Self-pay | Admitting: Internal Medicine

## 2023-09-19 ENCOUNTER — Other Ambulatory Visit: Payer: Self-pay | Admitting: Internal Medicine

## 2023-09-19 DIAGNOSIS — K219 Gastro-esophageal reflux disease without esophagitis: Secondary | ICD-10-CM

## 2023-09-22 ENCOUNTER — Ambulatory Visit: Admitting: Internal Medicine

## 2023-09-23 ENCOUNTER — Ambulatory Visit (INDEPENDENT_AMBULATORY_CARE_PROVIDER_SITE_OTHER): Admitting: Internal Medicine

## 2023-09-23 ENCOUNTER — Encounter: Payer: Self-pay | Admitting: Internal Medicine

## 2023-09-23 VITALS — BP 130/70 | HR 72 | Ht 66.0 in | Wt 169.0 lb

## 2023-09-23 DIAGNOSIS — I152 Hypertension secondary to endocrine disorders: Secondary | ICD-10-CM

## 2023-09-23 DIAGNOSIS — E782 Mixed hyperlipidemia: Secondary | ICD-10-CM | POA: Diagnosis not present

## 2023-09-23 DIAGNOSIS — Z1211 Encounter for screening for malignant neoplasm of colon: Secondary | ICD-10-CM

## 2023-09-23 DIAGNOSIS — E1165 Type 2 diabetes mellitus with hyperglycemia: Secondary | ICD-10-CM | POA: Diagnosis not present

## 2023-09-23 DIAGNOSIS — E1169 Type 2 diabetes mellitus with other specified complication: Secondary | ICD-10-CM

## 2023-09-23 DIAGNOSIS — E1159 Type 2 diabetes mellitus with other circulatory complications: Secondary | ICD-10-CM | POA: Diagnosis not present

## 2023-09-23 DIAGNOSIS — N951 Menopausal and female climacteric states: Secondary | ICD-10-CM

## 2023-09-23 LAB — POCT CBG (FASTING - GLUCOSE)-MANUAL ENTRY: Glucose Fasting, POC: 109 mg/dL — AB (ref 70–99)

## 2023-09-23 NOTE — Progress Notes (Signed)
 Established Patient Office Visit  Subjective:  Patient ID: Morgan Ochoa, female    DOB: 21-Apr-1956  Age: 68 y.o. MRN: [id number]  Chief Complaint  Patient presents with   Follow-up    Patient comes in for her follow-up.  She has returned from her trip and did not need to take Cipro .  However the clonidine  tablets are helping with her hot flashes and night sweats, does get a dry mouth, but will continue to use it. Labs were done previously discussed today, hemoglobin A1c is 7.4, understands to start a strict diet control.  Patient mentions that she gets several loose stools per day without abdominal cramps or blood in it.  She has not lost any weight, no fevers or chills or abdominal pain.  She has not lost any weight and suspect it is related to metformin.   However her last colonoscopy was in 2015, will send a GI referral for her colonoscopy this year.    No other concerns at this time.   Past Medical History:  Diagnosis Date   Cardiac murmur    from pt medical record   Chest pain    from pt medical record   DM2 (diabetes mellitus, type 2) (HCC)    from pt medical record   GERD (gastroesophageal reflux disease)    from pt medical record   History of colon polyps    from pt medical record   Hypertension    Onychomycosis    from pt medical record    Past Surgical History:  Procedure Laterality Date   ABDOMINAL HYSTERECTOMY     cholonoscopy  2007, 2015   from pt medical record   OOPHORECTOMY     REDUCTION MAMMAPLASTY Bilateral     Social History   Socioeconomic History   Marital status: Married    Spouse name: Not on file   Number of children: Not on file   Years of education: Not on file   Highest education level: Not on file  Occupational History   Not on file  Tobacco Use   Smoking status: Never   Smokeless tobacco: Never  Substance and Sexual Activity   Alcohol use: No   Drug use: No   Sexual activity: Not on file  Other Topics Concern   Not on  file  Social History Narrative   Not on file   Social Drivers of Health   Financial Resource Strain: Low Risk  (03/13/2017)   Received from [facility], [facility]   Overall Financial Resource Strain (CARDIA)    Difficulty of Paying Living Expenses: Not hard at all  Food Insecurity: No Food Insecurity (03/13/2017)   Received from [facility], [facility]   Hunger Vital Sign    Worried About Running Out of Food in the Last Year: Never true    Ran Out of Food in the Last Year: Never true  Transportation Needs: No Transportation Needs (03/13/2017)   Received from [facility], [facility]   PRAPARE - Transportation    Lack of Transportation (Medical): No    Lack of Transportation (Non-Medical): No  Physical Activity: Not on file  Stress: Not on file  Social Connections: Not on file  Intimate Partner Violence: Not on file    Family History  Problem Relation Age of Onset   Hypertension Mother    Breast cancer Neg Hx     Allergies  Allergen Reactions  Ace Inhibitors     Outpatient Medications Prior to Visit  Medication Sig   allopurinol (ZYLOPRIM) 100 MG tablet TAKE 1 TABLET BY MOUTH TWICE A DAY   amLODipine (NORVASC) 10 MG tablet TAKE 1 TABLET BY MOUTH EVERY DAY   aspirin 81 MG chewable tablet Chew 81 mg by mouth daily.   cloNIDine  (CATAPRES ) 0.2 MG tablet Take 1 tablet (0.2 mg total) by mouth at bedtime.   FARXIGA 10 MG TABS tablet TAKE 1 TABLET BY MOUTH EVERY DAY   levothyroxine  (SYNTHROID ) 88 MCG tablet Take 1 tablet (88 mcg total) by mouth daily.   metFORMIN (GLUCOPHAGE) 500 MG tablet TAKE 1 TABLET BY MOUTH TWICE A DAY   metoprolol  succinate (TOPROL -XL) 25 MG 24 hr tablet Take 1 tablet (25 mg total) by mouth daily.   rosuvastatin (CRESTOR) 10 MG tablet TAKE 1 TABLET BY MOUTH EVERY DAY   ALPRAZolam  (XANAX ) 0.25 MG tablet Take 1 tablet (0.25 mg total) by mouth at  bedtime as needed for anxiety. (Patient not taking: Reported on 09/23/2023)   ibandronate  (BONIVA ) 150 MG tablet Take 1 tablet (150 mg total) by mouth every 30 (thirty) days. Take in the morning with a full glass of water, on an empty stomach, and do not take anything else by mouth or lie down for the next 30 min. (Patient not taking: Reported on 09/23/2023)   pantoprazole  (PROTONIX ) 40 MG tablet TAKE 1 TABLET BY MOUTH EVERY DAY (Patient not taking: Reported on 09/23/2023)   No facility-administered medications prior to visit.    Review of Systems  Constitutional: Negative.  Negative for chills, fever, malaise/fatigue and weight loss.  HENT: Negative.  Negative for sore throat.   Eyes: Negative.   Respiratory: Negative.  Negative for cough and shortness of breath.   Cardiovascular: Negative.  Negative for chest pain, palpitations and leg swelling.  Gastrointestinal:  Positive for diarrhea. Negative for abdominal pain, constipation, heartburn, nausea and vomiting.  Genitourinary: Negative.  Negative for dysuria and flank pain.  Musculoskeletal: Negative.  Negative for joint pain and myalgias.  Skin: Negative.   Neurological: Negative.  Negative for dizziness, tingling, tremors and headaches.  Endo/Heme/Allergies: Negative.   Psychiatric/Behavioral: Negative.  Negative for depression and suicidal ideas. The patient is not nervous/anxious.        Objective:   BP 130/70   Pulse 72   Ht 5\' 6"  (1.676 m)   Wt 169 lb (76.7 kg)   SpO2 98%   BMI 27.28 kg/m   Vitals:   09/23/23 1051  BP: 130/70  Pulse: 72  Height: 5\' 6"  (1.676 m)  Weight: 169 lb (76.7 kg)  SpO2: 98%  BMI (Calculated): 27.29    Physical Exam Vitals and nursing note reviewed.  Constitutional:      Appearance: Normal appearance.  HENT:     Head: Normocephalic and atraumatic.     Nose: Nose normal.     Mouth/Throat:     Mouth: Mucous membranes are moist.     Pharynx: Oropharynx is clear.  Eyes:      Conjunctiva/sclera: Conjunctivae normal.     Pupils: Pupils are equal, round, and reactive to light.  Cardiovascular:     Rate and Rhythm: Normal rate and regular rhythm.     Pulses: Normal pulses.     Heart sounds: Normal heart sounds. No murmur heard. Pulmonary:     Effort: Pulmonary effort is normal.     Breath sounds: Normal breath sounds. No wheezing.  Abdominal:     General:  Bowel sounds are normal.     Palpations: Abdomen is soft.     Tenderness: There is no abdominal tenderness. There is no right CVA tenderness or left CVA tenderness.  Musculoskeletal:        General: Normal range of motion.     Cervical back: Normal range of motion.     Right lower leg: No edema.     Left lower leg: No edema.  Skin:    General: Skin is warm and dry.  Neurological:     General: No focal deficit present.     Mental Status: She is alert and oriented to person, place, and time.  Psychiatric:        Mood and Affect: Mood normal.        Behavior: Behavior normal.      Results for orders placed or performed in visit on 09/23/23  POCT CBG (Fasting - Glucose)  Result Value Ref Range   Glucose Fasting, POC 109 (A) 70 - 99 mg/dL    Recent Results (from the past 2160 hours)  Hemoglobin A1c     Status: Abnormal   Collection Time: 08/28/23 10:35 AM  Result Value Ref Range   Hgb A1c MFr Bld 7.4 (H) 4.8 - 5.6 %    Comment:          Prediabetes: 5.7 - 6.4          Diabetes: >6.4          Glycemic control for adults with diabetes: <7.0    Est. average glucose Bld gHb Est-mCnc 166 mg/dL  [id number]     Status: Abnormal   Collection Time: 08/28/23 10:35 AM  Result Value Ref Range   Glucose 121 (H) 70 - 99 mg/dL   BUN 13 8 - 27 mg/dL   Creatinine, Ser 1.32 0.57 - 1.00 mg/dL   eGFR 98 >44 WN/UUV/2.53   BUN/Creatinine Ratio 22 12 - 28   Sodium 142 134 - 144 mmol/L   Potassium 4.1 3.5 - 5.2 mmol/L   Chloride 102 96 - 106 mmol/L   CO2 23 20 - 29 mmol/L   Calcium 9.6 8.7 - 10.3 mg/dL   Total  Protein 7.4 6.0 - 8.5 g/dL   Albumin 4.4 3.9 - 4.9 g/dL   Globulin, Total 3.0 1.5 - 4.5 g/dL   Bilirubin Total 0.2 0.0 - 1.2 mg/dL   Alkaline Phosphatase 77 44 - 121 IU/L   AST 26 0 - 40 IU/L   ALT 25 0 - 32 IU/L  Lipid panel     Status: Abnormal   Collection Time: 08/28/23 10:35 AM  Result Value Ref Range   Cholesterol, Total 85 (L) 100 - 199 mg/dL   Triglycerides 85 0 - 149 mg/dL   HDL 27 (L) >66 mg/dL   VLDL Cholesterol Cal 17 5 - 40 mg/dL   LDL Chol Calc (NIH) 41 0 - 99 mg/dL   Chol/HDL Ratio 3.1 0.0 - 4.4 ratio    Comment:                                   T. Chol/HDL Ratio                                             Men  Women  1/2 Avg.Risk  3.4    3.3                                   Avg.Risk  5.0    4.4                                2X Avg.Risk  9.6    7.1                                3X Avg.Risk 23.4   11.0   TSH+T4F+T3Free     Status: None   Collection Time: 08/28/23 10:35 AM  Result Value Ref Range   TSH 1.130 0.450 - 4.500 uIU/mL   T3, Free 3.0 2.0 - 4.4 pg/mL   Free T4 1.21 0.82 - 1.77 ng/dL  POCT CBG (Fasting - Glucose)     Status: Abnormal   Collection Time: 08/28/23 10:54 AM  Result Value Ref Range   Glucose Fasting, POC 114 (A) 70 - 99 mg/dL  POCT CBG (Fasting - Glucose)     Status: Abnormal   Collection Time: 09/23/23 10:56 AM  Result Value Ref Range   Glucose Fasting, POC 109 (A) 70 - 99 mg/dL      Assessment & Plan:  Continue current medications.  Strict diet control emphasized.  GI referral for colonoscopy. Problem List Items Addressed This Visit     Type 2 diabetes mellitus with hyperglycemia, without long-term current use of insulin (HCC)   Relevant Orders   POCT CBG (Fasting - Glucose) (Completed)   Hypertension associated with diabetes (HCC) - Primary   Combined hyperlipidemia associated with type 2 diabetes mellitus (HCC)   Hot flashes due to menopause   Other Visit Diagnoses       Colon cancer screening        Relevant Orders   Ambulatory referral to Gastroenterology       Return in about 4 months (around 01/24/2024).   Total time spent: 30 minutes  Aisha Hove, MD  09/23/2023   This document may have been prepared by [facility] Voice Recognition software and as such may include unintentional dictation errors.

## 2023-11-04 ENCOUNTER — Other Ambulatory Visit: Payer: Self-pay | Admitting: Internal Medicine

## 2023-11-04 DIAGNOSIS — I10 Essential (primary) hypertension: Secondary | ICD-10-CM

## 2023-12-31 ENCOUNTER — Other Ambulatory Visit: Payer: Self-pay | Admitting: Cardiology

## 2023-12-31 ENCOUNTER — Ambulatory Visit (INDEPENDENT_AMBULATORY_CARE_PROVIDER_SITE_OTHER): Admitting: Cardiology

## 2023-12-31 ENCOUNTER — Encounter: Payer: Self-pay | Admitting: Cardiology

## 2023-12-31 VITALS — BP 160/78 | HR 98 | Ht 66.0 in | Wt 164.0 lb

## 2023-12-31 DIAGNOSIS — I10 Essential (primary) hypertension: Secondary | ICD-10-CM

## 2023-12-31 DIAGNOSIS — B349 Viral infection, unspecified: Secondary | ICD-10-CM | POA: Insufficient documentation

## 2023-12-31 LAB — POCT XPERT XPRESS SARS COVID-2/FLU/RSV
FLU A: NEGATIVE
FLU B: NEGATIVE
RSV RNA, PCR: NEGATIVE
SARS Coronavirus 2: POSITIVE

## 2023-12-31 MED ORDER — PAXLOVID (300/100) 20 X 150 MG & 10 X 100MG PO TBPK: 3.0000 | ORAL_TABLET | Freq: Two times a day (BID) | ORAL | 0 refills | Status: DC

## 2023-12-31 NOTE — Progress Notes (Signed)
 Established Patient Office Visit  Subjective:  Patient ID: Morgan Ochoa, female    DOB: 28-Nov-1955  Age: 68 y.o. MRN: [id number]  Chief Complaint  Patient presents with   Sore Throat   Headache    Patient in office for an acute visit. Complaining of a sore throat and a headache, body aches, congestion, cough. Patient reports having a fever, did not check her temperature so unknown what it was. Patient reports having two immunocompromised grandchildren, wants to make sure she is not going to pass an illness to them. States her work station was used by individuals with an illness.  Will swab patient for RSV/Covid/Flu.  Sore Throat  This is a new problem. The current episode started in the past 7 days. The problem has been gradually worsening. Neither side of throat is experiencing more pain than the other. The pain is mild. Associated symptoms include congestion, coughing, headaches and trouble swallowing. Pertinent negatives include no abdominal pain, diarrhea, ear pain or shortness of breath. Exposure to: unknown. She has tried nothing for the symptoms. The treatment provided no relief.    No other concerns at this time.   Past Medical History:  Diagnosis Date   Cardiac murmur    from pt medical record   Chest pain    from pt medical record   DM2 (diabetes mellitus, type 2) (HCC)    from pt medical record   GERD (gastroesophageal reflux disease)    from pt medical record   History of colon polyps    from pt medical record   Hypertension    Onychomycosis    from pt medical record    Past Surgical History:  Procedure Laterality Date   ABDOMINAL HYSTERECTOMY     cholonoscopy  2007, 2015   from pt medical record   OOPHORECTOMY     REDUCTION MAMMAPLASTY Bilateral     Social History   Socioeconomic History   Marital status: Married    Spouse name: Not on file   Number of children: Not on file   Years of education: Not on file   Highest education level: Not on file   Occupational History   Not on file  Tobacco Use   Smoking status: Never   Smokeless tobacco: Never  Substance and Sexual Activity   Alcohol use: No   Drug use: No   Sexual activity: Not on file  Other Topics Concern   Not on file  Social History Narrative   Not on file   Social Drivers of Health   Financial Resource Strain: Low Risk  (03/13/2017)   Received [facility]   Overall Financial Resource Strain (CARDIA)    Difficulty of Paying Living Expenses: Not hard at all  Food Insecurity: No Food Insecurity (03/13/2017)   Received from [facility]   Hunger Vital Sign    Worried About Running Out of Food in the Last Year: Never true    Ran Out of Food in the Last Year: Never true  Transportation Needs: No Transportation Needs (03/13/2017)   Received from [facility]   PRAPARE - Transportation    Lack of Transportation (Medical): No    Lack of Transportation (Non-Medical): No  Physical Activity: Not on file  Stress: Not on file  Social Connections: Not on file  Intimate Partner Violence: Not on file    Family History  Problem Relation Age of Onset   Hypertension Mother    Breast cancer  Neg Hx     Allergies  Allergen Reactions   Ace Inhibitors     Outpatient Medications Prior to Visit  Medication Sig   allopurinol (ZYLOPRIM) 100 MG tablet TAKE 1 TABLET BY MOUTH TWICE A DAY   amLODipine (NORVASC) 10 MG tablet TAKE 1 TABLET BY MOUTH EVERY DAY   aspirin 81 MG chewable tablet Chew 81 mg by mouth daily.   cloNIDine  (CATAPRES ) 0.2 MG tablet Take 1 tablet (0.2 mg total) by mouth at bedtime.   FARXIGA 10 MG TABS tablet TAKE 1 TABLET BY MOUTH EVERY DAY   levothyroxine  (SYNTHROID ) 88 MCG tablet Take 1 tablet (88 mcg total) by mouth daily.   metFORMIN (GLUCOPHAGE) 500 MG tablet TAKE 1 TABLET BY MOUTH TWICE A DAY   metoprolol  succinate (TOPROL -XL) 25 MG 24 hr tablet TAKE 1 TABLET (25 MG TOTAL) BY MOUTH DAILY.    rosuvastatin (CRESTOR) 10 MG tablet TAKE 1 TABLET BY MOUTH EVERY DAY   ALPRAZolam  (XANAX ) 0.25 MG tablet Take 1 tablet (0.25 mg total) by mouth at bedtime as needed for anxiety. (Patient not taking: Reported on 12/31/2023)   ibandronate  (BONIVA ) 150 MG tablet Take 1 tablet (150 mg total) by mouth every 30 (thirty) days. Take in the morning with a full glass of water, on an empty stomach, and do not take anything else by mouth or lie down for the next 30 min. (Patient not taking: Reported on 12/31/2023)   pantoprazole  (PROTONIX ) 40 MG tablet TAKE 1 TABLET BY MOUTH EVERY DAY (Patient not taking: Reported on 12/31/2023)   No facility-administered medications prior to visit.    Review of Systems  Constitutional: Negative.   HENT:  Positive for congestion, sore throat and trouble swallowing. Negative for ear pain.   Eyes: Negative.   Respiratory:  Positive for cough. Negative for shortness of breath.   Cardiovascular: Negative.  Negative for chest pain.  Gastrointestinal: Negative.  Negative for abdominal pain, constipation and diarrhea.  Genitourinary: Negative.   Musculoskeletal:  Negative for joint pain and myalgias.  Skin: Negative.   Neurological:  Positive for headaches. Negative for dizziness.  Endo/Heme/Allergies: Negative.   All other systems reviewed and are negative.      Objective:   BP (!) 160/78   Pulse 98   Ht 5' 6 (1.676 m)   Wt 164 lb (74.4 kg)   SpO2 95%   BMI 26.47 kg/m   Vitals:   12/31/23 1106  BP: (!) 160/78  Pulse: 98  Height: 5' 6 (1.676 m)  Weight: 164 lb (74.4 kg)  SpO2: 95%  BMI (Calculated): 26.48    Physical Exam Vitals and nursing note reviewed.  Constitutional:      Appearance: Normal appearance. She is normal weight.  HENT:     Head: Normocephalic and atraumatic.     Nose: Nose normal.     Mouth/Throat:     Mouth: Mucous membranes are moist.  Eyes:     Extraocular Movements: Extraocular movements intact.     Conjunctiva/sclera:  Conjunctivae normal.     Pupils: Pupils are equal, round, and reactive to light.  Cardiovascular:     Rate and Rhythm: Regular rhythm.     Pulses: Normal pulses.     Heart sounds: Normal heart sounds.  Pulmonary:     Effort: Pulmonary effort is normal.     Breath sounds: Normal breath sounds.  Abdominal:     General: Abdomen is flat. Bowel sounds are normal.     Palpations: Abdomen is soft.  Musculoskeletal:        General: Normal range of motion.     Cervical back: Normal range of motion.  Skin:    General: Skin is warm and dry.  Neurological:     General: No focal deficit present.     Mental Status: She is alert and oriented to person, place, and time.  Psychiatric:        Mood and Affect: Mood normal.        Behavior: Behavior normal.        Thought Content: Thought content normal.        Judgment: Judgment normal.      No results found for any visits on 12/31/23.  No results found for this or any previous visit (from the past 2160 hours).    Assessment & Plan:  Will swab patient for RSV/Covid/Flu.  Problem List Items Addressed This Visit       Other   Viral illness - Primary    Return if symptoms worsen or fail to improve.   Total time spent: 25 minutes  [employer], NP  12/31/2023   This document may have been prepared by Dragon Voice Recognition software and as such may include unintentional dictation errors.

## 2023-12-31 NOTE — Addendum Note (Signed)
 Addended by: GLADIS HOWARD on: 12/31/2023 12:19 PM   Modules accepted: Orders

## 2024-01-01 ENCOUNTER — Ambulatory Visit: Payer: Self-pay | Admitting: Cardiology

## 2024-01-07 ENCOUNTER — Ambulatory Visit (INDEPENDENT_AMBULATORY_CARE_PROVIDER_SITE_OTHER)

## 2024-01-07 ENCOUNTER — Ambulatory Visit: Payer: Self-pay | Admitting: Cardiology

## 2024-01-07 DIAGNOSIS — J069 Acute upper respiratory infection, unspecified: Secondary | ICD-10-CM

## 2024-01-07 LAB — POCT XPERT XPRESS SARS COVID-2/FLU/RSV
FLU A: NEGATIVE
FLU B: NEGATIVE
RSV RNA, PCR: NEGATIVE
SARS Coronavirus 2: POSITIVE

## 2024-01-07 NOTE — Progress Notes (Signed)
 Patient notified

## 2024-01-13 ENCOUNTER — Encounter: Payer: Self-pay | Admitting: Cardiology

## 2024-01-13 ENCOUNTER — Ambulatory Visit (INDEPENDENT_AMBULATORY_CARE_PROVIDER_SITE_OTHER): Admitting: Cardiology

## 2024-01-13 VITALS — BP 154/78 | HR 105 | Ht 66.0 in | Wt 158.0 lb

## 2024-01-13 DIAGNOSIS — J0100 Acute maxillary sinusitis, unspecified: Secondary | ICD-10-CM | POA: Insufficient documentation

## 2024-01-13 DIAGNOSIS — I10 Essential (primary) hypertension: Secondary | ICD-10-CM

## 2024-01-13 DIAGNOSIS — U099 Post covid-19 condition, unspecified: Secondary | ICD-10-CM

## 2024-01-13 MED ORDER — AZITHROMYCIN 250 MG PO TABS
ORAL_TABLET | ORAL | 0 refills | Status: AC
Start: 2024-01-13 — End: 2024-01-18

## 2024-01-13 MED ORDER — FLUTICASONE PROPIONATE 50 MCG/ACT NA SUSP: 1.0000 | Freq: Every day | NASAL | 2 refills | Status: AC

## 2024-01-13 NOTE — Progress Notes (Signed)
 Established Patient Office Visit  Subjective:  Patient ID: Morgan Ochoa, female    DOB: [birth year]  Age: 68 y.o. MRN: [id number]  Chief Complaint  Patient presents with   Acute Visit    Post Covid x 13 days out, Sinus pressure    Patient in office for an acute visit, complaining of possible sinus infection. Had Covid 2 weeks ago, was treated with Paxlovid . Patient in office today complaining of sinus pressure, cough, PND, and congestion. Will send in a Z-pack and Flonase . Increase water intake. Mucinex recommended.   Sinus Problem This is a new problem. The current episode started in the past 7 days. There has been no fever. Associated symptoms include congestion, coughing and sinus pressure. Pertinent negatives include no headaches, shortness of breath or sore throat. Past treatments include nasal decongestants and oral decongestants (Mucinex). The treatment provided no relief.    No other concerns at this time.   Past Medical History:  Diagnosis Date   Cardiac murmur    from pt medical record   Chest pain    from pt medical record   DM2 (diabetes mellitus, type 2) (HCC)    from pt medical record   GERD (gastroesophageal reflux disease)    from pt medical record   History of colon polyps    from pt medical record   Hypertension    Onychomycosis    from pt medical record    Past Surgical History:  Procedure Laterality Date   ABDOMINAL HYSTERECTOMY     cholonoscopy  2007, 2015   from pt medical record   OOPHORECTOMY     REDUCTION MAMMAPLASTY Bilateral     Social History   Socioeconomic History   Marital status: Married    Spouse name: Not on file   Number of children: Not on file   Years of education: Not on file   Highest education level: Not on file  Occupational History   Not on file  Tobacco Use   Smoking status: Never   Smokeless tobacco: Never  Substance and Sexual Activity   Alcohol use: No   Drug use: No   Sexual activity: Not on file  Other  Topics Concern   Not on file  Social History Narrative   Not on file   Social Drivers of Health   Financial Resource Strain: Low Risk  (03/13/2017)   Received from [facility]   Overall Financial Resource Strain (CARDIA)    Difficulty of Paying Living Expenses: Not hard at all  Food Insecurity: No Food Insecurity (03/13/2017)   Received from [facility]   Hunger Vital Sign    Worried About Running Out of Food in the Last Year: Never true    Ran Out of Food in the Last Year: Never true  Transportation Needs: No Transportation Needs (03/13/2017)   Received from [facility]   PRAPARE - Transportation    Lack of Transportation (Medical): No    Lack of Transportation (Non-Medical): No  Physical Activity: Not on file  Stress: Not on file  Social Connections: Not on file  Intimate Partner Violence: Not on file    Family History  Problem Relation Age of Onset   Hypertension Mother    Breast cancer Neg Hx     Allergies  Allergen Reactions   Ace Inhibitors     Outpatient Medications Prior to Visit  Medication Sig   allopurinol (ZYLOPRIM) 100 MG tablet TAKE 1 TABLET BY MOUTH  TWICE A DAY   amLODipine (NORVASC) 10 MG tablet TAKE 1 TABLET BY MOUTH EVERY DAY   aspirin 81 MG chewable tablet Chew 81 mg by mouth daily.   cloNIDine  (CATAPRES ) 0.2 MG tablet Take 1 tablet (0.2 mg total) by mouth at bedtime.   FARXIGA 10 MG TABS tablet TAKE 1 TABLET BY MOUTH EVERY DAY   levothyroxine  (SYNTHROID ) 88 MCG tablet Take 1 tablet (88 mcg total) by mouth daily.   metFORMIN (GLUCOPHAGE) 500 MG tablet TAKE 1 TABLET BY MOUTH TWICE A DAY   metoprolol  succinate (TOPROL -XL) 25 MG 24 hr tablet TAKE 1 TABLET (25 MG TOTAL) BY MOUTH DAILY.   rosuvastatin (CRESTOR) 10 MG tablet TAKE 1 TABLET BY MOUTH EVERY DAY   [DISCONTINUED] nirmatrelvir/ritonavir (PAXLOVID , 300/100,) 20 x 150 MG & 10 x 100MG  TBPK Take 3 tablets by mouth every 12 (twelve) hours.    [DISCONTINUED] ALPRAZolam  (XANAX ) 0.25 MG tablet Take 1 tablet (0.25 mg total) by mouth at bedtime as needed for anxiety. (Patient not taking: Reported on 12/31/2023)   [DISCONTINUED] ibandronate  (BONIVA ) 150 MG tablet Take 1 tablet (150 mg total) by mouth every 30 (thirty) days. Take in the morning with a full glass of water, on an empty stomach, and do not take anything else by mouth or lie down for the next 30 min. (Patient not taking: Reported on 12/31/2023)   [DISCONTINUED] pantoprazole  (PROTONIX ) 40 MG tablet TAKE 1 TABLET BY MOUTH EVERY DAY (Patient not taking: Reported on 12/31/2023)   No facility-administered medications prior to visit.    Review of Systems  Constitutional: Negative.   HENT:  Positive for congestion, sinus pressure and sinus pain. Negative for sore throat.   Eyes: Negative.   Respiratory:  Positive for cough. Negative for sputum production and shortness of breath.   Cardiovascular: Negative.  Negative for chest pain.  Gastrointestinal: Negative.  Negative for abdominal pain, constipation and diarrhea.  Genitourinary: Negative.   Musculoskeletal:  Negative for joint pain and myalgias.  Skin: Negative.   Neurological: Negative.  Negative for dizziness and headaches.  Endo/Heme/Allergies: Negative.   All other systems reviewed and are negative.      Objective:   BP (!) 154/78   Pulse (!) 105   Ht 5' 6 (1.676 m)   Wt 158 lb (71.7 kg)   SpO2 98%   BMI 25.50 kg/m   Vitals:   01/13/24 1104  BP: (!) 154/78  Pulse: (!) 105  Height: 5' 6 (1.676 m)  Weight: 158 lb (71.7 kg)  SpO2: 98%  BMI (Calculated): 25.51    Physical Exam Vitals and nursing note reviewed.  Constitutional:      Appearance: Normal appearance. She is normal weight.  HENT:     Head: Normocephalic and atraumatic.     Nose: Nose normal.     Mouth/Throat:     Mouth: Mucous membranes are moist.     Pharynx: Oropharynx is clear.  Eyes:     Extraocular Movements: Extraocular movements  intact.     Conjunctiva/sclera: Conjunctivae normal.     Pupils: Pupils are equal, round, and reactive to light.  Cardiovascular:     Rate and Rhythm: Normal rate and regular rhythm.     Pulses: Normal pulses.  Pulmonary:     Effort: Pulmonary effort is normal.     Breath sounds: Normal breath sounds.  Abdominal:     General: Abdomen is flat.     Palpations: Abdomen is soft.  Musculoskeletal:  General: Normal range of motion.     Cervical back: Normal range of motion and neck supple.     Right lower leg: No edema.     Left lower leg: No edema.  Skin:    General: Skin is warm and dry.  Neurological:     General: No focal deficit present.     Mental Status: She is alert and oriented to person, place, and time. Mental status is at baseline.  Psychiatric:        Mood and Affect: Mood normal.        Behavior: Behavior normal.        Thought Content: Thought content normal.        Judgment: Judgment normal.      No results found for any visits on 01/13/24.  Recent Results (from the past 2160 hours)  POCT XPERT XPRESS SARS COVID-2/FLU/RSV [[id number]]     Status: Abnormal   Collection Time: 12/31/23 12:17 PM  Result Value Ref Range   SARS Coronavirus 2 positive    FLU A negative    FLU B negative    RSV RNA, PCR negative   POCT XPERT XPRESS SARS COVID-2/FLU/RSV     Status: None   Collection Time: 01/07/24 11:23 AM  Result Value Ref Range   SARS Coronavirus 2 Positive    FLU A Negative    FLU B Negative    RSV RNA, PCR Negative       Assessment & Plan:  Z-pack Flonase  Increase water intake Mucinex  Problem List Items Addressed This Visit       Respiratory   Acute non-recurrent maxillary sinusitis - Primary   Relevant Medications   fluticasone  (FLONASE ) 50 MCG/ACT nasal spray   azithromycin  (ZITHROMAX ) 250 MG tablet    Return if symptoms worsen or fail to improve.   Total time spent: 25 minutes  [employer], NP  01/13/2024   This document may  have been prepared by Dragon Voice Recognition software and as such may include unintentional dictation errors.

## 2024-01-27 ENCOUNTER — Ambulatory Visit (INDEPENDENT_AMBULATORY_CARE_PROVIDER_SITE_OTHER): Admitting: Internal Medicine

## 2024-01-27 ENCOUNTER — Encounter: Payer: Self-pay | Admitting: Internal Medicine

## 2024-01-27 VITALS — BP 134/78 | HR 92 | Ht 66.0 in | Wt 165.2 lb

## 2024-01-27 DIAGNOSIS — Z1231 Encounter for screening mammogram for malignant neoplasm of breast: Secondary | ICD-10-CM

## 2024-01-27 DIAGNOSIS — I152 Hypertension secondary to endocrine disorders: Secondary | ICD-10-CM

## 2024-01-27 DIAGNOSIS — E782 Mixed hyperlipidemia: Secondary | ICD-10-CM

## 2024-01-27 DIAGNOSIS — E1159 Type 2 diabetes mellitus with other circulatory complications: Secondary | ICD-10-CM

## 2024-01-27 DIAGNOSIS — E1165 Type 2 diabetes mellitus with hyperglycemia: Secondary | ICD-10-CM

## 2024-01-27 DIAGNOSIS — E1169 Type 2 diabetes mellitus with other specified complication: Secondary | ICD-10-CM | POA: Diagnosis not present

## 2024-01-27 DIAGNOSIS — E038 Other specified hypothyroidism: Secondary | ICD-10-CM

## 2024-01-27 MED ORDER — LEVOTHYROXINE SODIUM 88 MCG PO TABS: 88.0000 ug | ORAL_TABLET | Freq: Every day | ORAL | 3 refills | Status: AC

## 2024-01-27 NOTE — Progress Notes (Signed)
 Established Patient Office Visit  Subjective:  Patient ID: Morgan Ochoa, female    DOB: 05-14-1956  Age: 68 y.o. MRN: [id number]  Chief Complaint  Patient presents with   Follow-up    4 month follow up    Patient is here today for routine follow up. She has recently recovered from covid and a sinus infection. She reports she is doing well and denies any new complaints. She reports that she still has frequent loose stools that do not bother her; she denies bloody stools or melena, abdominal cramps, fever or chills. In the past we have stopped Metformin to determine if it was causing her symptoms. Encouraged patient to think about alternative medications to help with diabetic control including injections or Rybelus. She has upcoming colonoscopy pre screening appointment scheduled. She is due for her mammogram; will send referral for that to be completed. Patients last DEXA was 05/2023. Patients last HbgA1c was up from 7.2 to 7.4% Will check fasting labs today.  Patient reported the last time she picked up her levothyroxine  medication the pharmacy dispensed 100 mcg instead of her current 88 mcg dose. She states she has taken about 5 of the 100 mcg because she did not have any 88 mcg remaining. Will send refill of levothyroxine  88 mcg to her pharmacy.      No other concerns at this time.   Past Medical History:  Diagnosis Date   Cardiac murmur    from pt medical record   Chest pain    from pt medical record   DM2 (diabetes mellitus, type 2) (HCC)    from pt medical record   GERD (gastroesophageal reflux disease)    from pt medical record   History of colon polyps    from pt medical record   Hypertension    Onychomycosis    from pt medical record    Past Surgical History:  Procedure Laterality Date   ABDOMINAL HYSTERECTOMY     cholonoscopy  2007, 2015   from pt medical record   OOPHORECTOMY     REDUCTION MAMMAPLASTY Bilateral     Social History   Socioeconomic History    Marital status: Married    Spouse name: Not on file   Number of children: Not on file   Years of education: Not on file   Highest education level: Not on file  Occupational History   Not on file  Tobacco Use   Smoking status: Never   Smokeless tobacco: Never  Substance and Sexual Activity   Alcohol use: No   Drug use: No   Sexual activity: Not on file  Other Topics Concern   Not on file  Social History Narrative   Not on file   Social Drivers of Health   Financial Resource Strain: Low Risk  (03/13/2017)   Received from [facility]   Overall Financial Resource Strain (CARDIA)    Difficulty of Paying Living Expenses: Not hard at all  Food Insecurity: No Food Insecurity (03/13/2017)   Received from [facility]   Hunger Vital Sign    Worried About Running Out of Food in the Last Year: Never true    Ran Out of Food in the Last Year: Never true  Transportation Needs: No Transportation Needs (03/13/2017)   Received from [facility]   PRAPARE - Transportation    Lack of Transportation (Medical): No    Lack of Transportation (Non-Medical): No  Physical Activity: Not on file  Stress: Not on file  Social Connections: Not on file  Intimate Partner Violence: Not on file    Family History  Problem Relation Age of Onset   Hypertension Mother    Breast cancer Neg Hx     Allergies  Allergen Reactions   Ace Inhibitors     Outpatient Medications Prior to Visit  Medication Sig   allopurinol (ZYLOPRIM) 100 MG tablet TAKE 1 TABLET BY MOUTH TWICE A DAY   amLODipine (NORVASC) 10 MG tablet TAKE 1 TABLET BY MOUTH EVERY DAY   aspirin 81 MG chewable tablet Chew 81 mg by mouth daily.   cloNIDine  (CATAPRES ) 0.2 MG tablet Take 1 tablet (0.2 mg total) by mouth at bedtime.   FARXIGA 10 MG TABS tablet TAKE 1 TABLET BY MOUTH EVERY DAY   fluticasone  (FLONASE ) 50 MCG/ACT nasal spray Place 1 spray into both nostrils daily.   metFORMIN  (GLUCOPHAGE) 500 MG tablet TAKE 1 TABLET BY MOUTH TWICE A DAY   metoprolol  succinate (TOPROL -XL) 25 MG 24 hr tablet TAKE 1 TABLET (25 MG TOTAL) BY MOUTH DAILY.   rosuvastatin (CRESTOR) 10 MG tablet TAKE 1 TABLET BY MOUTH EVERY DAY   [DISCONTINUED] levothyroxine  (SYNTHROID ) 88 MCG tablet Take 1 tablet (88 mcg total) by mouth daily.   No facility-administered medications prior to visit.    Review of Systems  Constitutional: Negative.  Negative for chills, diaphoresis, fever, malaise/fatigue and weight loss.  HENT: Negative.  Negative for sore throat.   Eyes: Negative.   Respiratory: Negative.  Negative for cough and shortness of breath.   Cardiovascular: Negative.  Negative for chest pain, palpitations and leg swelling.  Gastrointestinal: Negative.  Negative for abdominal pain, blood in stool, constipation, diarrhea, heartburn, melena, nausea and vomiting.  Genitourinary: Negative.  Negative for dysuria and flank pain.  Musculoskeletal: Negative.  Negative for joint pain and myalgias.  Skin: Negative.   Neurological: Negative.  Negative for dizziness, tingling, tremors, sensory change and headaches.  Endo/Heme/Allergies: Negative.   Psychiatric/Behavioral: Negative.  Negative for depression and suicidal ideas. The patient is not nervous/anxious.        Objective:   BP 134/78   Pulse 92   Ht 5' 6 (1.676 m)   Wt 165 lb 3.2 oz (74.9 kg)   SpO2 98%   BMI 26.66 kg/m   Vitals:   01/27/24 1008  BP: 134/78  Pulse: 92  Height: 5' 6 (1.676 m)  Weight: 165 lb 3.2 oz (74.9 kg)  SpO2: 98%  BMI (Calculated): 26.68    Physical Exam Vitals and nursing note reviewed.  Constitutional:      Appearance: Normal appearance.  HENT:     Head: Normocephalic and atraumatic.     Nose: Nose normal.     Mouth/Throat:     Mouth: Mucous membranes are moist.     Pharynx: Oropharynx is clear.  Eyes:     Conjunctiva/sclera: Conjunctivae normal.     Pupils: Pupils are equal, round, and reactive  to light.  Cardiovascular:     Rate and Rhythm: Normal rate and regular rhythm.     Pulses: Normal pulses.     Heart sounds: Normal heart sounds. No murmur heard. Pulmonary:     Effort: Pulmonary effort is normal.     Breath sounds: Normal breath sounds. No wheezing.  Abdominal:     General: Bowel sounds are normal.     Palpations: Abdomen is soft.     Tenderness: There is no abdominal tenderness. There is no right CVA  tenderness or left CVA tenderness.  Musculoskeletal:        General: Normal range of motion.     Cervical back: Normal range of motion.     Right lower leg: No edema.     Left lower leg: No edema.  Skin:    General: Skin is warm and dry.  Neurological:     General: No focal deficit present.     Mental Status: She is alert and oriented to person, place, and time.  Psychiatric:        Mood and Affect: Mood normal.        Behavior: Behavior normal.      No results found for any visits on 01/27/24.  Recent Results (from the past 2160 hours)  POCT XPERT XPRESS SARS COVID-2/FLU/RSV [[id number]]     Status: Abnormal   Collection Time: 12/31/23 12:17 PM  Result Value Ref Range   SARS Coronavirus 2 positive    FLU A negative    FLU B negative    RSV RNA, PCR negative   POCT XPERT XPRESS SARS COVID-2/FLU/RSV     Status: None   Collection Time: 01/07/24 11:23 AM  Result Value Ref Range   SARS Coronavirus 2 Positive    FLU A Negative    FLU B Negative    RSV RNA, PCR Negative       Assessment & Plan:  Continue taking medications as prescribed. Will collect fasting labs today and follow up with patient on the results. Referral sent for mammogram to be completed. Encouraged healthy diet and exercise as tolerated. Will send refill for Levothyroxine  88 mcg once daily.  Problem List Items Addressed This Visit     Type 2 diabetes mellitus with hyperglycemia, without long-term current use of insulin (HCC) - Primary   Relevant Orders   Hemoglobin A1c   Other  specified hypothyroidism   Relevant Medications   levothyroxine  (SYNTHROID ) 88 MCG tablet   Other Relevant Orders   TSH+T4F+T3Free   Hypertension associated with diabetes (HCC)   Relevant Orders   CMP14+EGFR   Lipid panel   Hemoglobin A1c   Combined hyperlipidemia associated with type 2 diabetes mellitus (HCC)   Relevant Orders   Lipid panel   Breast cancer screening by mammogram   Relevant Orders   MM 3D SCREENING MAMMOGRAM BILATERAL BREAST    Return in about 3 months (around 04/27/2024).   Total time spent: 30 minutes  FERNAND FREDY RAMAN, MD  01/27/2024   This document may have been prepared by [facility] Voice Recognition software and as such may include unintentional dictation errors.

## 2024-01-28 LAB — CMP14+EGFR
ALT: 17 IU/L (ref 0–32)
AST: 18 IU/L (ref 0–40)
Albumin: 4.5 g/dL (ref 3.9–4.9)
Alkaline Phosphatase: 59 IU/L (ref 44–121)
BUN/Creatinine Ratio: 16 (ref 12–28)
BUN: 10 mg/dL (ref 8–27)
Bilirubin Total: 0.3 mg/dL (ref 0.0–1.2)
CO2: 24 mmol/L (ref 20–29)
Calcium: 9.3 mg/dL (ref 8.7–10.3)
Chloride: 103 mmol/L (ref 96–106)
Creatinine, Ser: 0.61 mg/dL (ref 0.57–1.00)
Globulin, Total: 3.1 g/dL (ref 1.5–4.5)
Glucose: 121 mg/dL — ABNORMAL HIGH (ref 70–99)
Potassium: 3.8 mmol/L (ref 3.5–5.2)
Sodium: 144 mmol/L (ref 134–144)
Total Protein: 7.6 g/dL (ref 6.0–8.5)
eGFR: 96 mL/min/1.73 (ref >59)

## 2024-01-28 LAB — LIPID PANEL
Chol/HDL Ratio: 2.8 ratio (ref 0.0–4.4)
Cholesterol, Total: 76 mg/dL — ABNORMAL LOW (ref 100–199)
HDL: 27 mg/dL — ABNORMAL LOW (ref >39)
LDL Chol Calc (NIH): 34 mg/dL (ref 0–99)
Triglycerides: 66 mg/dL (ref 0–149)
VLDL Cholesterol Cal: 15 mg/dL (ref 5–40)

## 2024-01-28 LAB — HEMOGLOBIN A1C
Est. average glucose Bld gHb Est-mCnc: 148 mg/dL
Hgb A1c MFr Bld: 6.8 % — ABNORMAL HIGH (ref 4.8–5.6)

## 2024-01-28 LAB — TSH+T4F+T3FREE
Free T4: 1.51 ng/dL (ref 0.82–1.77)
T3, Free: 3.1 pg/mL (ref 2.0–4.4)
TSH: 0.714 u[IU]/mL (ref 0.450–4.500)

## 2024-01-29 ENCOUNTER — Ambulatory Visit: Payer: Self-pay | Admitting: Internal Medicine

## 2024-02-03 NOTE — Progress Notes (Signed)
 Patient notified

## 2024-03-01 ENCOUNTER — Other Ambulatory Visit: Payer: Self-pay | Admitting: Internal Medicine

## 2024-03-01 DIAGNOSIS — E782 Mixed hyperlipidemia: Secondary | ICD-10-CM

## 2024-04-03 ENCOUNTER — Other Ambulatory Visit: Payer: Self-pay | Admitting: Internal Medicine

## 2024-04-03 DIAGNOSIS — M1000 Idiopathic gout, unspecified site: Secondary | ICD-10-CM

## 2024-04-08 ENCOUNTER — Encounter: Payer: Self-pay | Admitting: Gastroenterology

## 2024-04-08 ENCOUNTER — Ambulatory Visit
Admission: RE | Admit: 2024-04-08 | Discharge: 2024-04-08 | Disposition: A | Discharge status: Home / Self Care | Attending: Gastroenterology | Admitting: Gastroenterology

## 2024-04-08 ENCOUNTER — Ambulatory Visit: Payer: Self-pay

## 2024-04-08 ENCOUNTER — Encounter
Admission: RE | Disposition: A | Discharge status: Home / Self Care | Payer: Self-pay | Source: Home / Self Care | Attending: Gastroenterology

## 2024-04-08 ENCOUNTER — Other Ambulatory Visit: Payer: Self-pay

## 2024-04-08 DIAGNOSIS — K219 Gastro-esophageal reflux disease without esophagitis: Secondary | ICD-10-CM | POA: Insufficient documentation

## 2024-04-08 DIAGNOSIS — Z79899 Other long term (current) drug therapy: Secondary | ICD-10-CM | POA: Diagnosis not present

## 2024-04-08 DIAGNOSIS — Z8249 Family history of ischemic heart disease and other diseases of the circulatory system: Secondary | ICD-10-CM | POA: Insufficient documentation

## 2024-04-08 DIAGNOSIS — E039 Hypothyroidism, unspecified: Secondary | ICD-10-CM | POA: Diagnosis not present

## 2024-04-08 DIAGNOSIS — K5730 Diverticulosis of large intestine without perforation or abscess without bleeding: Secondary | ICD-10-CM | POA: Diagnosis not present

## 2024-04-08 DIAGNOSIS — E119 Type 2 diabetes mellitus without complications: Secondary | ICD-10-CM | POA: Insufficient documentation

## 2024-04-08 DIAGNOSIS — Z1211 Encounter for screening for malignant neoplasm of colon: Secondary | ICD-10-CM | POA: Diagnosis present

## 2024-04-08 DIAGNOSIS — Z7984 Long term (current) use of oral hypoglycemic drugs: Secondary | ICD-10-CM | POA: Insufficient documentation

## 2024-04-08 DIAGNOSIS — I10 Essential (primary) hypertension: Secondary | ICD-10-CM | POA: Insufficient documentation

## 2024-04-08 HISTORY — PX: COLONOSCOPY: SHX5424

## 2024-04-08 LAB — GLUCOSE, CAPILLARY: Glucose-Capillary: 111 mg/dL — ABNORMAL HIGH (ref 70–99)

## 2024-04-08 SURGERY — COLONOSCOPY
Anesthesia: General

## 2024-04-08 MED ORDER — SODIUM CHLORIDE 0.9 % IV SOLN: INTRAVENOUS | Status: DC

## 2024-04-08 MED ORDER — PROPOFOL 500 MG/50ML IV EMUL
INTRAVENOUS | Status: DC | PRN
  Administered 2024-04-08: 150 ug/kg/min via INTRAVENOUS
  Administered 2024-04-08 (×2): 50 mg via INTRAVENOUS

## 2024-04-08 NOTE — Anesthesia Postprocedure Evaluation (Signed)
 Anesthesia Post Note  Patient: Morgan Ochoa  Procedure(s) Performed: COLONOSCOPY  Patient location during evaluation: Endoscopy Anesthesia Type: General Level of consciousness: awake and alert Pain management: pain level controlled Vital Signs Assessment: post-procedure vital signs reviewed and stable Respiratory status: spontaneous breathing, nonlabored ventilation and respiratory function stable Cardiovascular status: blood pressure returned to baseline and stable Postop Assessment: no apparent nausea or vomiting Anesthetic complications: no   There were no known notable events for this encounter.   Last Vitals:  Vitals:   04/08/24 0842 04/08/24 0852  BP: 123/75 125/78  Pulse: 88 76  Resp: 15 16  Temp:    SpO2: 100% 100%    Last Pain:  Vitals:   04/08/24 0842  TempSrc:   PainSc: 0-No pain                 Fairy POUR Aracelli Woloszyn

## 2024-04-08 NOTE — Anesthesia Preprocedure Evaluation (Signed)
 Anesthesia Evaluation  Patient identified by MRN, date of birth, ID band Patient awake    Reviewed: Allergy & Precautions, NPO status , Patient's Chart, lab work & pertinent test results  History of Anesthesia Complications Negative for: history of anesthetic complications  Airway Mallampati: III  TM Distance: >3 FB Neck ROM: full    Dental  (+) Chipped   Pulmonary neg pulmonary ROS, neg shortness of breath   Pulmonary exam normal        Cardiovascular Exercise Tolerance: Good hypertension, (-) angina Normal cardiovascular exam     Neuro/Psych negative neurological ROS  negative psych ROS   GI/Hepatic Neg liver ROS,GERD  Controlled,,  Endo/Other  diabetes, Type 2Hypothyroidism    Renal/GU negative Renal ROS  negative genitourinary   Musculoskeletal   Abdominal   Peds  Hematology negative hematology ROS (+)   Anesthesia Other Findings Past Medical History: No date: Cardiac murmur     Comment:  from pt medical record No date: Chest pain     Comment:  from pt medical record No date: DM2 (diabetes mellitus, type 2) (HCC)     Comment:  from pt medical record No date: GERD (gastroesophageal reflux disease)     Comment:  from pt medical record No date: History of colon polyps     Comment:  from pt medical record No date: Hypertension No date: Onychomycosis     Comment:  from pt medical record  Past Surgical History: No date: ABDOMINAL HYSTERECTOMY 2007, 2015: cholonoscopy     Comment:  from pt medical record No date: OOPHORECTOMY No date: REDUCTION MAMMAPLASTY; Bilateral     Reproductive/Obstetrics negative OB ROS                              Anesthesia Physical Anesthesia Plan  ASA: 3  Anesthesia Plan: General   Post-op Pain Management:    Induction: Intravenous  PONV Risk Score and Plan: Propofol  infusion and TIVA  Airway Management Planned: Natural Airway and Nasal  Cannula  Additional Equipment:   Intra-op Plan:   Post-operative Plan:   Informed Consent: I have reviewed the patients History and Physical, chart, labs and discussed the procedure including the risks, benefits and alternatives for the proposed anesthesia with the patient or authorized representative who has indicated his/her understanding and acceptance.     Dental Advisory Given  Plan Discussed with: Anesthesiologist, CRNA and Surgeon  Anesthesia Plan Comments: (Patient consented for risks of anesthesia including but not limited to:  - adverse reactions to medications - risk of airway placement if required - damage to eyes, teeth, lips or other oral mucosa - nerve damage due to positioning  - sore throat or hoarseness - Damage to heart, brain, nerves, lungs, other parts of body or loss of life  Patient voiced understanding and assent.)        Anesthesia Quick Evaluation

## 2024-04-08 NOTE — Transfer of Care (Signed)
 Immediate Anesthesia Transfer of Care Note  Patient: Morgan Ochoa  Procedure(s) Performed: COLONOSCOPY  Patient Location: PACU and Endoscopy Unit  Anesthesia Type:General  Level of Consciousness: drowsy  Airway & Oxygen Therapy: Patient Spontanous Breathing  Post-op Assessment: Report given to RN and Post -op Vital signs reviewed and stable  Post vital signs: Reviewed and stable  Last Vitals:  Vitals Value Taken Time  BP 130/70 04/08/24 08:31  Temp    Pulse 85 04/08/24 08:32  Resp 17 04/08/24 08:32  SpO2 99 % 04/08/24 08:32  Vitals shown include unfiled device data.  Last Pain:  Vitals:   04/08/24 0803  TempSrc: Temporal  PainSc: 0-No pain         Complications: There were no known notable events for this encounter.

## 2024-04-08 NOTE — Op Note (Signed)
 [facility] Gastroenterology Patient Name: Morgan Ochoa Procedure Date: 04/08/2024 7:59 AM MRN: [id number] Account #: [id number] Date of Birth: 1956/04/05 Admit Type: Outpatient Age: 68 Room: [facility] ENDO ROOM 2 Gender: Female Note Status: Finalized Instrument Name: Colon Scope [id number] Procedure:             Colonoscopy Indications:           Screening for colorectal malignant neoplasm Providers:             Ruel Kung MD, MD Referring MD:          Fredy CANDIE Bathe, MD (Referring MD) Medicines:             Monitored Anesthesia Care Complications:         No immediate complications. Procedure:             Pre-Anesthesia Assessment:                        - Prior to the procedure, a History and Physical was                         performed, and patient medications, allergies and                         sensitivities were reviewed. The patient's tolerance                         of previous anesthesia was reviewed.                        - The risks and benefits of the procedure and the                         sedation options and risks were discussed with the                         patient. All questions were answered and informed                         consent was obtained.                        - ASA Grade Assessment: II - A patient with mild                         systemic disease.                        After obtaining informed consent, the colonoscope was                         passed under direct vision. Throughout the procedure,                         the patient's blood pressure, pulse, and oxygen                         saturations were monitored continuously. The                         Colonoscope  was introduced through the anus and                         advanced to the the cecum, identified by the                         appendiceal orifice. The colonoscopy was performed                         with ease. The patient tolerated the procedure well.                          The quality of the bowel preparation was excellent.                         The ileocecal valve, appendiceal orifice, and rectum                         were photographed. Findings:      The perianal and digital rectal examinations were normal.      Multiple medium-mouthed diverticula were found in the right colon.      The exam was otherwise without abnormality on direct and retroflexion       views. Impression:            - Diverticulosis in the right colon.                        - The examination was otherwise normal on direct and                         retroflexion views.                        - No specimens collected. Recommendation:        - Discharge patient to home (with escort).                        - Resume previous diet.                        - Continue present medications.                        - Repeat colonoscopy is not recommended due to current                         age (83 years or older) for screening purposes. Procedure Code(s):     --- Professional ---                        [id number], Colonoscopy, flexible; diagnostic, including                         collection of specimen(s) by brushing or washing, when                         performed (separate procedure) Diagnosis Code(s):     --- Professional ---  Z12.11, Encounter for screening for malignant neoplasm                         of colon                        K57.30, Diverticulosis of large intestine without                         perforation or abscess without bleeding CPT copyright 2022 [employer]. All rights reserved. The codes documented in this report are preliminary and upon coder review may  be revised to meet current compliance requirements. Ruel Kung, MD Ruel Kung MD, MD 04/08/2024 8:30:55 AM This report has been signed electronically. Number of Addenda: 0 Note Initiated On: 04/08/2024 7:59 AM Scope Withdrawal Time: 0 hours 12  minutes 34 seconds  Total Procedure Duration: 0 hours 16 minutes 15 seconds  Estimated Blood Loss:  Estimated blood loss: none.      [facility]

## 2024-04-08 NOTE — H&P (Signed)
 Ruel Kung , MD [address], [place], [place], [address] Phone: [phone] Fax: [phone]  Primary Care Physician:  Fernand Fredy RAMAN, MD   Pre-Procedure History & Physical: HPI:  Morgan Ochoa is a 68 y.o. female is here for an colonoscopy.   Past Medical History:  Diagnosis Date   Cardiac murmur    from pt medical record   Chest pain    from pt medical record   DM2 (diabetes mellitus, type 2) (HCC)    from pt medical record   GERD (gastroesophageal reflux disease)    from pt medical record   History of colon polyps    from pt medical record   Hypertension    Onychomycosis    from pt medical record    Past Surgical History:  Procedure Laterality Date   ABDOMINAL HYSTERECTOMY     cholonoscopy  2007, 2015   from pt medical record   OOPHORECTOMY     REDUCTION MAMMAPLASTY Bilateral     Prior to Admission medications   Medication Sig Start Date End Date Taking? Authorizing Provider  allopurinol (ZYLOPRIM) 100 MG tablet TAKE 1 TABLET BY MOUTH TWICE A DAY 04/05/24   Fernand Fredy RAMAN, MD  amLODipine (NORVASC) 10 MG tablet TAKE 1 TABLET BY MOUTH EVERY DAY 05/26/23   Fernand Fredy RAMAN, MD  aspirin 81 MG chewable tablet Chew 81 mg by mouth daily.    [provider]  cloNIDine  (CATAPRES ) 0.2 MG tablet Take 1 tablet (0.2 mg total) by mouth at bedtime. 08/28/23   Fernand Fredy RAMAN, MD  FARXIGA 10 MG TABS tablet TAKE 1 TABLET BY MOUTH EVERY DAY 09/04/23   Fernand Fredy RAMAN, MD  fluticasone  (FLONASE ) 50 MCG/ACT nasal spray Place 1 spray into both nostrils daily. 01/13/24 01/12/25  Scoggins, Amber, NP  levothyroxine  (SYNTHROID ) 100 MCG tablet Take 100 mcg by mouth daily. 02/21/24   [provider]  levothyroxine  (SYNTHROID ) 88 MCG tablet Take 1 tablet (88 mcg total) by mouth daily. 01/27/24 01/26/25  Fernand Fredy RAMAN, MD  metFORMIN (GLUCOPHAGE) 500  MG tablet TAKE 1 TABLET BY MOUTH TWICE A DAY 05/26/23   Fernand Fredy RAMAN, MD  metoprolol  succinate (TOPROL -XL) 25 MG 24 hr tablet TAKE 1 TABLET (25 MG TOTAL) BY MOUTH DAILY. 11/06/23   Fernand Fredy RAMAN, MD  rosuvastatin (CRESTOR) 10 MG tablet TAKE 1 TABLET BY MOUTH EVERY DAY 03/01/24   Fernand Fredy RAMAN, MD    Allergies as of 03/15/2024 - Review Complete 01/27/2024  Allergen Reaction Noted   Ace inhibitors  09/30/2022    Family History  Problem Relation Age of Onset   Hypertension Mother    Breast cancer Neg Hx     Social History   Socioeconomic History   Marital status: Married    Spouse name: Not on file   Number of children: Not on file   Years of education: Not on file   Highest education level: Not on file  Occupational History   Not on file  Tobacco Use   Smoking status: Never   Smokeless tobacco: Never  Substance and Sexual Activity   Alcohol use: No   Drug use: No   Sexual activity: Not on file  Other Topics Concern   Not on file  Social History Narrative   Not on file   Social Drivers of Health   Financial Resource Strain: Low Risk  (02/24/2024)   Received from [facility]   Overall Financial Resource Strain (  CARDIA)    Difficulty of Paying Living Expenses: Not hard at all  Food Insecurity: No Food Insecurity (02/24/2024)   Received from [facility]   Hunger Vital Sign    Within the past 12 months, you worried that your food would run out before you got the money to buy more.: Never true    Within the past 12 months, the food you bought just didn't last and you didn't have money to get more.: Never true  Transportation Needs: No Transportation Needs (02/24/2024)   Received from [facility] - Transportation    In the past 12 months, has lack of transportation kept you from medical appointments or from getting medications?: No    Lack of Transportation (Non-Medical): No  Physical Activity: Not on file   Stress: Not on file  Social Connections: Not on file  Intimate Partner Violence: Not on file    Review of Systems: See HPI, otherwise negative ROS  Physical Exam: There were no vitals taken for this visit. General:   Alert,  pleasant and cooperative in NAD Head:  Normocephalic and atraumatic. Neck:  Supple; no masses or thyromegaly. Lungs:  Clear throughout to auscultation, normal respiratory effort.    Heart:  +S1, +S2, Regular rate and rhythm, No edema. Abdomen:  Soft, nontender and nondistended. Normal bowel sounds, without guarding, and without rebound.   Neurologic:  Alert and  oriented x4;  grossly normal neurologically.  Impression/Plan: Reham D Darius is here for an colonoscopy to be performed for Screening colonoscopy average risk   Risks, benefits, limitations, and alternatives regarding  colonoscopy have been reviewed with the patient.  Questions have been answered.  All parties agreeable.   Ruel Kung, MD  04/08/2024, 7:44 AM

## 2024-04-20 ENCOUNTER — Other Ambulatory Visit: Payer: Self-pay | Admitting: Internal Medicine

## 2024-04-27 ENCOUNTER — Encounter: Payer: Self-pay | Admitting: Internal Medicine

## 2024-04-27 ENCOUNTER — Ambulatory Visit: Payer: Self-pay | Admitting: Internal Medicine

## 2024-04-27 ENCOUNTER — Ambulatory Visit: Admitting: Internal Medicine

## 2024-04-27 VITALS — BP 124/82 | HR 85 | Ht 66.0 in | Wt 166.8 lb

## 2024-04-27 DIAGNOSIS — M791 Myalgia, unspecified site: Secondary | ICD-10-CM

## 2024-04-27 DIAGNOSIS — M1000 Idiopathic gout, unspecified site: Secondary | ICD-10-CM

## 2024-04-27 DIAGNOSIS — I10 Essential (primary) hypertension: Secondary | ICD-10-CM

## 2024-04-27 DIAGNOSIS — E790 Hyperuricemia without signs of inflammatory arthritis and tophaceous disease: Secondary | ICD-10-CM

## 2024-04-27 DIAGNOSIS — G8929 Other chronic pain: Secondary | ICD-10-CM

## 2024-04-27 DIAGNOSIS — E782 Mixed hyperlipidemia: Secondary | ICD-10-CM

## 2024-04-27 DIAGNOSIS — E038 Other specified hypothyroidism: Secondary | ICD-10-CM

## 2024-04-27 DIAGNOSIS — E1169 Type 2 diabetes mellitus with other specified complication: Secondary | ICD-10-CM

## 2024-04-27 DIAGNOSIS — E1165 Type 2 diabetes mellitus with hyperglycemia: Secondary | ICD-10-CM

## 2024-04-27 DIAGNOSIS — E1159 Type 2 diabetes mellitus with other circulatory complications: Secondary | ICD-10-CM

## 2024-04-27 LAB — POCT URINALYSIS DIPSTICK
Bilirubin, UA: NEGATIVE
Blood, UA: NEGATIVE
Glucose, UA: POSITIVE — AB
Ketones, UA: NEGATIVE
Leukocytes, UA: NEGATIVE
Nitrite, UA: NEGATIVE
Protein, UA: NEGATIVE
Spec Grav, UA: >=1.03 — AB (ref 1.010–1.025)
Urobilinogen, UA: 0.2 U/dL
pH, UA: 6 (ref 5.0–8.0)

## 2024-04-27 LAB — POCT CBG (FASTING - GLUCOSE)-MANUAL ENTRY: Glucose Fasting, POC: 108 mg/dL — AB (ref 70–99)

## 2024-04-27 NOTE — Progress Notes (Signed)
 Patient notified

## 2024-04-27 NOTE — Progress Notes (Signed)
 Established Patient Office Visit  Subjective:  Patient ID: Morgan Ochoa, female    DOB: 09/03/1955  Age: 68 y.o. MRN: [id number]  Chief Complaint  Patient presents with   Follow-up    3 month follow up. Joint pain.     Patient comes in for her follow up today. She is feeling well in general but mentions some discomfort in joints especially Left wrist. Also left mid back pain- over left paraspinal muscles. No Mid back pain or tenderness , no radiation of pain.No dysuria of burning micturition.Urine dipstick in office is unremarkable. Patient was not able to tolerate Clonidine  - so stopped it. Fasting for labs today. Mammogram pending.    No other concerns at this time.   Past Medical History:  Diagnosis Date   Cardiac murmur    from pt medical record   Chest pain    from pt medical record   DM2 (diabetes mellitus, type 2) (HCC)    from pt medical record   GERD (gastroesophageal reflux disease)    from pt medical record   History of colon polyps    from pt medical record   Hypertension    Onychomycosis    from pt medical record    Past Surgical History:  Procedure Laterality Date   ABDOMINAL HYSTERECTOMY     cholonoscopy  2007, 2015   from pt medical record   COLONOSCOPY N/A 04/08/2024   Procedure: COLONOSCOPY;  Surgeon: Therisa Bi, MD;  Location: [facility] ENDOSCOPY;  Service: Gastroenterology;  Laterality: N/A;   OOPHORECTOMY     REDUCTION MAMMAPLASTY Bilateral     Social History   Socioeconomic History   Marital status: Married    Spouse name: Not on file   Number of children: Not on file   Years of education: Not on file   Highest education level: Not on file  Occupational History   Not on file  Tobacco Use   Smoking status: Never   Smokeless tobacco: Never  Substance and Sexual Activity   Alcohol use: No   Drug use: No   Sexual activity: Not on file  Other Topics Concern   Not on file  Social History Narrative   Not on file   Social Drivers  of Health   Financial Resource Strain: Low Risk  (02/24/2024)   Received from [facility]   Overall Financial Resource Strain (CARDIA)    Difficulty of Paying Living Expenses: Not hard at all  Food Insecurity: No Food Insecurity (02/24/2024)   Received from [facility]   Hunger Vital Sign    Within the past 12 months, you worried that your food would run out before you got the money to buy more.: Never true    Within the past 12 months, the food you bought just didn't last and you didn't have money to get more.: Never true  Transportation Needs: No Transportation Needs (02/24/2024)   Received from [facility] - Transportation    In the past 12 months, has lack of transportation kept you from medical appointments or from getting medications?: No    Lack of Transportation (Non-Medical): No  Physical Activity: Not on file  Stress: Not on file  Social Connections: Not on file  Intimate Partner Violence: Not on file    Family History  Problem Relation Age of Onset   Hypertension Mother    Breast cancer Neg Hx     Allergies  Allergen Reactions  Ace Inhibitors     Outpatient Medications Prior to Visit  Medication Sig   allopurinol (ZYLOPRIM) 100 MG tablet TAKE 1 TABLET BY MOUTH TWICE A DAY   amLODipine (NORVASC) 10 MG tablet TAKE 1 TABLET BY MOUTH EVERY DAY   aspirin 81 MG chewable tablet Chew 81 mg by mouth daily.   FARXIGA 10 MG TABS tablet TAKE 1 TABLET BY MOUTH EVERY DAY   fluticasone  (FLONASE ) 50 MCG/ACT nasal spray Place 1 spray into both nostrils daily.   levothyroxine  (SYNTHROID ) 88 MCG tablet Take 1 tablet (88 mcg total) by mouth daily.   metFORMIN (GLUCOPHAGE) 500 MG tablet TAKE 1 TABLET BY MOUTH TWICE A DAY   metoprolol  succinate (TOPROL -XL) 25 MG 24 hr tablet TAKE 1 TABLET (25 MG TOTAL) BY MOUTH DAILY.   rosuvastatin (CRESTOR) 10 MG tablet TAKE 1 TABLET BY MOUTH EVERY DAY   [DISCONTINUED] cloNIDine   (CATAPRES ) 0.2 MG tablet Take 1 tablet (0.2 mg total) by mouth at bedtime. (Patient not taking: Reported on 04/27/2024)   [DISCONTINUED] levothyroxine  (SYNTHROID ) 100 MCG tablet Take 100 mcg by mouth daily. (Patient not taking: Reported on 04/27/2024)   No facility-administered medications prior to visit.    Review of Systems  Constitutional: Negative.  Negative for chills, fever and malaise/fatigue.  HENT: Negative.  Negative for congestion and sore throat.   Eyes: Negative.  Negative for blurred vision and pain.  Respiratory: Negative.  Negative for cough and shortness of breath.   Cardiovascular: Negative.  Negative for chest pain, palpitations and leg swelling.  Gastrointestinal: Negative.  Negative for abdominal pain, blood in stool, constipation, diarrhea, heartburn, melena, nausea and vomiting.  Genitourinary: Negative.  Negative for dysuria, flank pain, frequency and urgency.  Musculoskeletal:  Positive for back pain, joint pain and myalgias.  Skin: Negative.   Neurological: Negative.  Negative for dizziness, tingling, sensory change, weakness and headaches.  Endo/Heme/Allergies: Negative.   Psychiatric/Behavioral: Negative.  Negative for depression and suicidal ideas. The patient is not nervous/anxious.        Objective:   BP 124/82   Pulse 85   Ht 5' 6 (1.676 m)   Wt 166 lb 12.8 oz (75.7 kg)   SpO2 99%   BMI 26.92 kg/m   Vitals:   04/27/24 1000  BP: 124/82  Pulse: 85  Height: 5' 6 (1.676 m)  Weight: 166 lb 12.8 oz (75.7 kg)  SpO2: 99%  BMI (Calculated): 26.94    Physical Exam Vitals and nursing note reviewed.  Constitutional:      Appearance: Normal appearance.  HENT:     Head: Normocephalic and atraumatic.     Nose: Nose normal.     Mouth/Throat:     Mouth: Mucous membranes are moist.     Pharynx: Oropharynx is clear.  Eyes:     Conjunctiva/sclera: Conjunctivae normal.     Pupils: Pupils are equal, round, and reactive to light.  Cardiovascular:      Rate and Rhythm: Normal rate and regular rhythm.     Pulses: Normal pulses.     Heart sounds: Normal heart sounds. No murmur heard. Pulmonary:     Effort: Pulmonary effort is normal.     Breath sounds: Normal breath sounds. No wheezing.  Abdominal:     General: Bowel sounds are normal.     Palpations: Abdomen is soft.     Tenderness: There is no abdominal tenderness. There is no right CVA tenderness or left CVA tenderness.  Musculoskeletal:        General:  Normal range of motion.     Cervical back: Normal range of motion.     Right lower leg: No edema.     Left lower leg: No edema.  Skin:    General: Skin is warm and dry.  Neurological:     General: No focal deficit present.     Mental Status: She is alert and oriented to person, place, and time.  Psychiatric:        Mood and Affect: Mood normal.        Behavior: Behavior normal.      Results for orders placed or performed in visit on 04/27/24  POCT CBG (Fasting - Glucose)  Result Value Ref Range   Glucose Fasting, POC 108 (A) 70 - 99 mg/dL  POCT urinalysis dipstick  Result Value Ref Range   Color, UA yellow    Clarity, UA clear    Glucose, UA Positive (A) Negative   Bilirubin, UA negative    Ketones, UA negative    Spec Grav, UA >=1.030 (A) 1.010 - 1.025   Blood, UA negative    pH, UA 6.0 5.0 - 8.0   Protein, UA Negative Negative   Urobilinogen, UA 0.2 0.2 or 1.0 E.U./dL   Nitrite, UA negative    Leukocytes, UA Negative Negative   Appearance     Odor      Recent Results (from the past 2160 hours)  Glucose, capillary     Status: Abnormal   Collection Time: 04/08/24  7:54 AM  Result Value Ref Range   Glucose-Capillary 111 (H) 70 - 99 mg/dL    Comment: Glucose reference range applies only to samples taken after fasting for at least 8 hours.  POCT CBG (Fasting - Glucose)     Status: Abnormal   Collection Time: 04/27/24 10:06 AM  Result Value Ref Range   Glucose Fasting, POC 108 (A) 70 - 99 mg/dL  POCT  urinalysis dipstick     Status: Abnormal   Collection Time: 04/27/24 10:49 AM  Result Value Ref Range   Color, UA yellow    Clarity, UA clear    Glucose, UA Positive (A) Negative   Bilirubin, UA negative    Ketones, UA negative    Spec Grav, UA >=1.030 (A) 1.010 - 1.025   Blood, UA negative    pH, UA 6.0 5.0 - 8.0   Protein, UA Negative Negative   Urobilinogen, UA 0.2 0.2 or 1.0 E.U./dL   Nitrite, UA negative    Leukocytes, UA Negative Negative   Appearance     Odor        Assessment & Plan:  Check labs today ,including Arthritis panel. Meanwhile try otc Alleve or Tylenol  if needed. Voltaren gel to back. Problem List Items Addressed This Visit     Essential hypertension, benign   Relevant Orders   Lipid panel   CMP14+EGFR   Type 2 diabetes mellitus with hyperglycemia, without long-term current use of insulin (HCC) - Primary   Relevant Orders   POCT CBG (Fasting - Glucose) (Completed)   Hemoglobin A1c   POCT urinalysis dipstick (Completed)   Idiopathic gout   Relevant Orders   CBC with Diff   Other specified hypothyroidism   Relevant Orders   TSH+T4F+T3Free   Mixed hyperlipidemia   Relevant Orders   Lipid panel   Hypertension associated with diabetes (HCC)   Relevant Orders   Hemoglobin A1c   Lipid panel   CMP14+EGFR   Combined hyperlipidemia associated with type 2 diabetes  mellitus (HCC)   Relevant Orders   Lipid panel   Other Visit Diagnoses       Hyperuricemia       Relevant Orders   Uric acid     Myalgia       Relevant Orders   CK, total     Chronic arthralgias of knees and hips       Relevant Orders   Arthritis Panel       Follow up 3 months.   Total time spent: 30 minutes. This time includes review of previous notes and results and patient face to face interaction during today's visit.    FERNAND FREDY RAMAN, MD  04/27/2024   This document may have been prepared by [facility] Voice Recognition software and as such may include unintentional  dictation errors.

## 2024-04-28 LAB — CBC WITH DIFFERENTIAL/PLATELET
Basophils Absolute: 0.1 x10E3/uL (ref 0.0–0.2)
Basos: 2 %
EOS (ABSOLUTE): 0.4 x10E3/uL (ref 0.0–0.4)
Eos: 5 %
Hematocrit: 42.4 % (ref 34.0–46.6)
Hemoglobin: 13.3 g/dL (ref 11.1–15.9)
Immature Grans (Abs): 0 x10E3/uL (ref 0.0–0.1)
Immature Granulocytes: 0 %
Lymphocytes Absolute: 2.3 x10E3/uL (ref 0.7–3.1)
Lymphs: 25 %
MCH: 26 pg — ABNORMAL LOW (ref 26.6–33.0)
MCHC: 31.4 g/dL — ABNORMAL LOW (ref 31.5–35.7)
MCV: 83 fL (ref 79–97)
Monocytes Absolute: 0.6 x10E3/uL (ref 0.1–0.9)
Monocytes: 7 %
Neutrophils Absolute: 5.5 x10E3/uL (ref 1.4–7.0)
Neutrophils: 61 %
Platelets: 385 x10E3/uL (ref 150–450)
RBC: 5.11 x10E6/uL (ref 3.77–5.28)
RDW: 14.6 % (ref 11.7–15.4)
WBC: 8.9 x10E3/uL (ref 3.4–10.8)

## 2024-04-28 LAB — CMP14+EGFR
ALT: 27 IU/L (ref 0–32)
AST: 30 IU/L (ref 0–40)
Albumin: 4.4 g/dL (ref 3.9–4.9)
Alkaline Phosphatase: 55 IU/L (ref 49–135)
BUN/Creatinine Ratio: 16 (ref 12–28)
BUN: 9 mg/dL (ref 8–27)
Bilirubin Total: 0.4 mg/dL (ref 0.0–1.2)
CO2: 26 mmol/L (ref 20–29)
Calcium: 9.5 mg/dL (ref 8.7–10.3)
Chloride: 104 mmol/L (ref 96–106)
Creatinine, Ser: 0.55 mg/dL — ABNORMAL LOW (ref 0.57–1.00)
Globulin, Total: 3.1 g/dL (ref 1.5–4.5)
Glucose: 121 mg/dL — ABNORMAL HIGH (ref 70–99)
Potassium: 4 mmol/L (ref 3.5–5.2)
Sodium: 144 mmol/L (ref 134–144)
Total Protein: 7.5 g/dL (ref 6.0–8.5)
eGFR: 99 mL/min/1.73 (ref >59)

## 2024-04-28 LAB — ARTHRITIS PANEL
Anti Nuclear Antibody (ANA): NEGATIVE
Rheumatoid fact SerPl-aCnc: <10 [IU]/mL (ref <14.0)
Sed Rate: 12 mm/h (ref 0–40)
Uric Acid: 2.7 mg/dL — ABNORMAL LOW (ref 3.0–7.2)

## 2024-04-28 LAB — LIPID PANEL
Chol/HDL Ratio: 2.9 ratio (ref 0.0–4.4)
Cholesterol, Total: 76 mg/dL — ABNORMAL LOW (ref 100–199)
HDL: 26 mg/dL — ABNORMAL LOW (ref >39)
LDL Chol Calc (NIH): 33 mg/dL (ref 0–99)
Triglycerides: 79 mg/dL (ref 0–149)
VLDL Cholesterol Cal: 17 mg/dL (ref 5–40)

## 2024-04-28 LAB — HEMOGLOBIN A1C
Est. average glucose Bld gHb Est-mCnc: 157 mg/dL
Hgb A1c MFr Bld: 7.1 % — ABNORMAL HIGH (ref 4.8–5.6)

## 2024-04-28 LAB — TSH+T4F+T3FREE
Free T4: 1.1 ng/dL (ref 0.82–1.77)
T3, Free: 2.9 pg/mL (ref 2.0–4.4)
TSH: 2.16 u[IU]/mL (ref 0.450–4.500)

## 2024-04-28 LAB — CK: Total CK: 99 U/L (ref 32–182)

## 2024-04-28 MED ORDER — MOUNJARO 2.5 MG/0.5ML ~~LOC~~ SOAJ: 2.5000 mg | SUBCUTANEOUS | 1 refills | Status: DC

## 2024-04-29 MED ORDER — RYBELSUS 3 MG PO TABS: 3.0000 mg | ORAL_TABLET | Freq: Every day | ORAL | 1 refills | Status: AC

## 2024-05-18 ENCOUNTER — Other Ambulatory Visit: Payer: Self-pay | Admitting: Internal Medicine

## 2024-05-18 DIAGNOSIS — I152 Hypertension secondary to endocrine disorders: Secondary | ICD-10-CM

## 2024-05-18 DIAGNOSIS — E038 Other specified hypothyroidism: Secondary | ICD-10-CM

## 2024-05-20 ENCOUNTER — Ambulatory Visit
Admission: EM | Admit: 2024-05-20 | Discharge: 2024-05-20 | Disposition: A | Discharge status: Home / Self Care | Source: Home / Self Care

## 2024-05-20 DIAGNOSIS — R6889 Other general symptoms and signs: Secondary | ICD-10-CM | POA: Diagnosis not present

## 2024-05-20 DIAGNOSIS — J111 Influenza due to unidentified influenza virus with other respiratory manifestations: Secondary | ICD-10-CM | POA: Diagnosis not present

## 2024-05-20 LAB — POCT INFLUENZA A/B
Influenza A, POC: NEGATIVE
Influenza B, POC: NEGATIVE

## 2024-05-20 MED ORDER — BENZONATATE 100 MG PO CAPS: 200.0000 mg | ORAL_CAPSULE | Freq: Three times a day (TID) | ORAL | 0 refills | Status: AC

## 2024-05-20 MED ORDER — IPRATROPIUM BROMIDE 0.06 % NA SOLN: 2.0000 | Freq: Four times a day (QID) | NASAL | 12 refills | Status: AC

## 2024-05-20 MED ORDER — PROMETHAZINE-DM 6.25-15 MG/5ML PO SYRP: 5.0000 mL | ORAL_SOLUTION | Freq: Four times a day (QID) | ORAL | 0 refills | Status: AC | PRN

## 2024-05-20 NOTE — ED Triage Notes (Signed)
 Sx x 4 days  Runny nose Headache with eye pain

## 2024-05-20 NOTE — Discharge Instructions (Addendum)
 Your flu test today was negative.  Your exam is consistent with a viral respiratory infection.  There are multiple viruses circulating for the community right now.  Use over-the-counter Tylenol  and/or ibuprofen  according to the package instructions as needed for any fever or pain.  Use the Atrovent nasal spray, 2 squirts in each nostril every 6 hours, as needed for runny nose and postnasal drip.  Use the Tessalon Perles every 8 hours during the day.  Take them with a small sip of water.  They may give you some numbness to the base of your tongue or a metallic taste in your mouth, this is normal.  Use the Promethazine DM cough syrup at bedtime for cough and congestion.  It will make you drowsy so do not take it during the day.  Return for reevaluation or see your primary care provider for any new or worsening symptoms.

## 2024-05-20 NOTE — ED Provider Notes (Signed)
 " [facility]    CSN: [id number] Arrival date & time: 05/20/24  1053      History   Chief Complaint Chief Complaint  Patient presents with   Nasal Congestion    HPI Morgan Ochoa is a 69 y.o. female.   HPI  69 year old female with past medical history significant for type 2 diabetes, idiopathic gout, essential hypertension, mixed hyperlipidemia, GERD, and situational anxiety presents for evaluation of flulike symptoms.  She reports that she has had a slight headache for 4 days but developed a runny nose, nasal congestion, and a slight nonproductive cough yesterday.  2 days ago she went to a funeral and there were a lot of people there were coughing.  She denies fever, ear pain, sore throat, shortness breath, or wheezing.  Past Medical History:  Diagnosis Date   Cardiac murmur    from pt medical record   Chest pain    from pt medical record   DM2 (diabetes mellitus, type 2) (HCC)    from pt medical record   GERD (gastroesophageal reflux disease)    from pt medical record   History of colon polyps    from pt medical record   Hypertension    Onychomycosis    from pt medical record    Patient Active Problem List   Diagnosis Date Noted   Breast cancer screening by mammogram 01/27/2024   Acute non-recurrent maxillary sinusitis 01/13/2024   Viral illness 12/31/2023   Combined hyperlipidemia associated with type 2 diabetes mellitus (HCC) 08/28/2023   Situational anxiety 08/28/2023   Gastroesophageal reflux disease without esophagitis 08/28/2023   Hot flashes due to menopause 08/28/2023   Hypertension associated with diabetes (HCC) 04/29/2023   Essential hypertension, benign 10/01/2022   Type 2 diabetes mellitus with hyperglycemia, without long-term current use of insulin (HCC) 10/01/2022   Idiopathic gout 10/01/2022   Other specified hypothyroidism 10/01/2022   Mixed hyperlipidemia 10/01/2022    Past Surgical History:  Procedure Laterality Date    ABDOMINAL HYSTERECTOMY     cholonoscopy  2007, 2015   from pt medical record   COLONOSCOPY N/A 04/08/2024   Procedure: COLONOSCOPY;  Surgeon: Therisa Bi, MD;  Location: [facility] ENDOSCOPY;  Service: Gastroenterology;  Laterality: N/A;   OOPHORECTOMY     REDUCTION MAMMAPLASTY Bilateral     OB History   No obstetric history on file.      Home Medications    Prior to Admission medications  Medication Sig Start Date End Date Taking? Authorizing Provider  allopurinol (ZYLOPRIM) 100 MG tablet TAKE 1 TABLET BY MOUTH TWICE A DAY 04/05/24  Yes Fernand Fredy RAMAN, MD  amLODipine (NORVASC) 10 MG tablet TAKE 1 TABLET BY MOUTH EVERY DAY 05/18/24  Yes Fernand Fredy RAMAN, MD  aspirin 81 MG chewable tablet Chew 81 mg by mouth daily.   Yes [provider]  benzonatate (TESSALON) 100 MG capsule Take 2 capsules (200 mg total) by mouth every 8 (eight) hours. 05/20/24  Yes Bernardino Ditch, NP  FARXIGA 10 MG TABS tablet TAKE 1 TABLET BY MOUTH EVERY DAY 09/04/23  Yes Fernand Fredy RAMAN, MD  fluticasone  (FLONASE ) 50 MCG/ACT nasal spray Place 1 spray into both nostrils daily. 01/13/24 01/12/25 Yes Scoggins, Amber, NP  ipratropium (ATROVENT) 0.06 % nasal spray Place 2 sprays into both nostrils 4 (four) times daily. 05/20/24  Yes Bernardino Ditch, NP  levothyroxine  (SYNTHROID ) 88 MCG tablet Take 1 tablet (88 mcg total) by mouth daily. 01/27/24 01/26/25 Yes Fernand Fredy RAMAN, MD  metFORMIN (GLUCOPHAGE) 500 MG tablet TAKE 1 TABLET BY MOUTH TWICE A DAY 05/26/23  Yes Fernand Fredy RAMAN, MD  metoprolol  succinate (TOPROL -XL) 25 MG 24 hr tablet TAKE 1 TABLET (25 MG TOTAL) BY MOUTH DAILY. 11/06/23  Yes Fernand Fredy RAMAN, MD  promethazine-dextromethorphan (PROMETHAZINE-DM) 6.25-15 MG/5ML syrup Take 5 mLs by mouth 4 (four) times daily as needed. 05/20/24  Yes Bernardino Ditch, NP  rosuvastatin (CRESTOR) 10 MG tablet TAKE 1 TABLET BY MOUTH EVERY DAY 03/01/24  Yes Fernand Fredy RAMAN, MD  Semaglutide  (RYBELSUS ) 3 MG TABS Take 1 tablet (3 mg total) by mouth daily before  breakfast. Take medication 30 minutes prior to eating or taking other medication 04/29/24   Fernand Fredy RAMAN, MD    Family History Family History  Problem Relation Age of Onset   Hypertension Mother    Breast cancer Neg Hx     Social History Social History[1]   Allergies   Ace inhibitors   Review of Systems Review of Systems  Constitutional:  Negative for fever.  HENT:  Positive for congestion and rhinorrhea. Negative for ear pain and sore throat.   Respiratory:  Positive for cough. Negative for shortness of breath and wheezing.   Neurological:  Positive for headaches.     Physical Exam Triage Vital Signs ED Triage Vitals  Encounter Vitals Group     BP      Girls Systolic BP Percentile      Girls Diastolic BP Percentile      Boys Systolic BP Percentile      Boys Diastolic BP Percentile      Pulse      Resp      Temp      Temp src      SpO2      Weight      Height      Head Circumference      Peak Flow      Pain Score      Pain Loc      Pain Education      Exclude from Growth Chart    No data found.  Updated Vital Signs BP (!) 159/80 (BP Location: Right Arm)   Pulse 82   Temp 98.5 F (36.9 C) (Oral)   Resp 18   Wt 162 lb (73.5 kg)   SpO2 95%   BMI 26.15 kg/m   Visual Acuity Right Eye Distance:   Left Eye Distance:   Bilateral Distance:    Right Eye Near:   Left Eye Near:    Bilateral Near:     Physical Exam Vitals and nursing note reviewed.  Constitutional:      Appearance: Normal appearance. She is not ill-appearing.  HENT:     Head: Normocephalic and atraumatic.     Right Ear: Tympanic membrane, ear canal and external ear normal. There is no impacted cerumen.     Left Ear: Tympanic membrane, ear canal and external ear normal. There is no impacted cerumen.     Nose: Congestion and rhinorrhea present.     Comments: Nasal mucosa is erythematous and mildly edematous with clear discharge in both nares.    Mouth/Throat:     Mouth: Mucous  membranes are moist.     Pharynx: Oropharynx is clear. No oropharyngeal exudate or posterior oropharyngeal erythema.  Cardiovascular:     Rate and Rhythm: Normal rate and regular rhythm.     Pulses: Normal pulses.     Heart sounds: Normal heart sounds. No murmur heard.  No friction rub. No gallop.  Pulmonary:     Effort: Pulmonary effort is normal.     Breath sounds: Normal breath sounds. No wheezing, rhonchi or rales.  Musculoskeletal:     Cervical back: Normal range of motion and neck supple. No tenderness.  Lymphadenopathy:     Cervical: No cervical adenopathy.  Skin:    General: Skin is warm and dry.     Capillary Refill: Capillary refill takes less than 2 seconds.     Findings: No rash.  Neurological:     General: No focal deficit present.     Mental Status: She is alert and oriented to person, place, and time.      UC Treatments / Results  Labs (all labs ordered are listed, but only abnormal results are displayed) Labs Reviewed  POCT INFLUENZA A/B - Normal    EKG   Radiology No results found.  Procedures Procedures (including critical care time)  Medications Ordered in UC Medications - No data to display  Initial Impression / Assessment and Plan / UC Course  I have reviewed the triage vital signs and the nursing notes.  Pertinent labs & imaging results that were available during my care of the patient were reviewed by me and considered in my medical decision making (see chart for details).   Patient is a nontoxic-appearing 69 year old female presenting for evaluation of flulike symptoms outlined in HPI above.  The patient has had a slight headache for 4 days but did not develop a respiratory symptoms until yesterday.  The day prior to symptom onset she was attending a funeral with a lot of attendance who were coughing.  She is unaware of any specific infectious exposure.  However, given the prevalence of influenza in the community I will order an influenza  antigen test.  Influenza antigen test is negative.  I will discharge patient home with diagnosis of flulike illness.  I will prescribe Atrovent nasal spray for her congestion and Tessalon Perles symptomatically and cough syrup for cough and congestion.  She may use over-the-counter Tylenol  and/or ibuprofen  as needed for fever or pain.  Return precautions reviewed.   Final Clinical Impressions(s) / UC Diagnoses   Final diagnoses:  Influenza-like symptoms  Influenza-like illness     Discharge Instructions      Your flu test today was negative.  Your exam is consistent with a viral respiratory infection.  There are multiple viruses circulating for the community right now.  Use over-the-counter Tylenol  and/or ibuprofen  according to the package instructions as needed for any fever or pain.  Use the Atrovent nasal spray, 2 squirts in each nostril every 6 hours, as needed for runny nose and postnasal drip.  Use the Tessalon Perles every 8 hours during the day.  Take them with a small sip of water.  They may give you some numbness to the base of your tongue or a metallic taste in your mouth, this is normal.  Use the Promethazine DM cough syrup at bedtime for cough and congestion.  It will make you drowsy so do not take it during the day.  Return for reevaluation or see your primary care provider for any new or worsening symptoms.      ED Prescriptions     Medication Sig Dispense Auth. Provider   benzonatate (TESSALON) 100 MG capsule Take 2 capsules (200 mg total) by mouth every 8 (eight) hours. 21 capsule Bernardino Ditch, NP   ipratropium (ATROVENT) 0.06 % nasal spray Place 2 sprays into  both nostrils 4 (four) times daily. 15 mL Bernardino Ditch, NP   promethazine-dextromethorphan (PROMETHAZINE-DM) 6.25-15 MG/5ML syrup Take 5 mLs by mouth 4 (four) times daily as needed. 118 mL Bernardino Ditch, NP      PDMP not reviewed this encounter.     [1]  Social History Tobacco Use   Smoking  status: Never   Smokeless tobacco: Never  Substance Use Topics   Alcohol use: No   Drug use: No     Bernardino Ditch, NP 05/20/24 1153  "

## 2024-05-28 ENCOUNTER — Ambulatory Visit
Admission: RE | Admit: 2024-05-28 | Discharge: 2024-05-28 | Disposition: A | Source: Ambulatory Visit | Attending: Internal Medicine | Admitting: Internal Medicine

## 2024-05-28 DIAGNOSIS — Z1231 Encounter for screening mammogram for malignant neoplasm of breast: Secondary | ICD-10-CM | POA: Insufficient documentation

## 2024-05-31 ENCOUNTER — Encounter: Payer: Self-pay | Admitting: Internal Medicine

## 2024-05-31 ENCOUNTER — Ambulatory Visit: Payer: Self-pay | Admitting: Internal Medicine

## 2024-05-31 ENCOUNTER — Ambulatory Visit (INDEPENDENT_AMBULATORY_CARE_PROVIDER_SITE_OTHER): Admitting: Internal Medicine

## 2024-05-31 VITALS — BP 152/88 | HR 91 | Ht 66.0 in | Wt 165.4 lb

## 2024-05-31 DIAGNOSIS — M1000 Idiopathic gout, unspecified site: Secondary | ICD-10-CM | POA: Diagnosis not present

## 2024-05-31 DIAGNOSIS — E1165 Type 2 diabetes mellitus with hyperglycemia: Secondary | ICD-10-CM

## 2024-05-31 DIAGNOSIS — E1159 Type 2 diabetes mellitus with other circulatory complications: Secondary | ICD-10-CM

## 2024-05-31 DIAGNOSIS — E038 Other specified hypothyroidism: Secondary | ICD-10-CM

## 2024-05-31 DIAGNOSIS — I152 Hypertension secondary to endocrine disorders: Secondary | ICD-10-CM | POA: Diagnosis not present

## 2024-05-31 DIAGNOSIS — E1169 Type 2 diabetes mellitus with other specified complication: Secondary | ICD-10-CM | POA: Diagnosis not present

## 2024-05-31 DIAGNOSIS — E782 Mixed hyperlipidemia: Secondary | ICD-10-CM | POA: Diagnosis not present

## 2024-05-31 LAB — POCT CBG (FASTING - GLUCOSE)-MANUAL ENTRY: Glucose Fasting, POC: 109 mg/dL — AB (ref 70–99)

## 2024-05-31 NOTE — Progress Notes (Signed)
 "  Established Patient Office Visit  Subjective:  Patient ID: Morgan Ochoa, female    DOB: 1955-12-14  Age: 69 y.o. MRN: [id number]  Chief Complaint  Patient presents with   Follow-up    Patient comes in for her follow-up today.  She was recently treated for flulike illness, and is feeling much better today.  Patient was advised to start GLP-1 agonist to help with glucose control as well as weight loss.  Initially Mounjaro  was sent, but patient wanted to start a tablet form so Rybelsus  3 mg has been sent to the pharmacy.  Today patient reports that she has not started it yet but will do so from today.  She has been busy over the last few months, does her blood pressure and hemoglobin A1c changed. Will check urine microalbumin next visit.    No other concerns at this time.   Past Medical History:  Diagnosis Date   Cardiac murmur    from pt medical record   Chest pain    from pt medical record   DM2 (diabetes mellitus, type 2) (HCC)    from pt medical record   GERD (gastroesophageal reflux disease)    from pt medical record   History of colon polyps    from pt medical record   Hypertension    Onychomycosis    from pt medical record    Past Surgical History:  Procedure Laterality Date   ABDOMINAL HYSTERECTOMY     cholonoscopy  2007, 2015   from pt medical record   COLONOSCOPY N/A 04/08/2024   Procedure: COLONOSCOPY;  Surgeon: Therisa Bi, MD;  Location: [facility] ENDOSCOPY;  Service: Gastroenterology;  Laterality: N/A;   OOPHORECTOMY     REDUCTION MAMMAPLASTY Bilateral     Social History   Socioeconomic History   Marital status: Married    Spouse name: Not on file   Number of children: Not on file   Years of education: Not on file   Highest education level: Not on file  Occupational History   Not on file  Tobacco Use   Smoking status: Never   Smokeless tobacco: Never  Substance and Sexual Activity   Alcohol use: No   Drug use: No   Sexual activity: Not on file   Other Topics Concern   Not on file  Social History Narrative   Not on file   Social Drivers of Health   Tobacco Use: Low Risk (05/31/2024)   Patient History    Smoking Tobacco Use: Never    Smokeless Tobacco Use: Never    Passive Exposure: Not on file  Financial Resource Strain: Low Risk  (02/24/2024)   Received from [facility]   Overall Financial Resource Strain (CARDIA)    Difficulty of Paying Living Expenses: Not hard at all  Food Insecurity: No Food Insecurity (02/24/2024)   Received from [facility]   Epic    Within the past 12 months, you worried that your food would run out before you got the money to buy more.: Never true    Within the past 12 months, the food you bought just didn't last and you didn't have money to get more.: Never true  Transportation Needs: No Transportation Needs (02/24/2024)   Received from [facility] - Transportation    In the past 12 months, has lack of transportation kept you from medical appointments or from getting medications?: No    Lack of Transportation (Non-Medical): No  Physical Activity: Not on file  Stress: Not on file  Social Connections: Not on file  Intimate Partner Violence: Not on file  Depression (PHQ2-9): Low Risk (04/27/2024)   Depression (PHQ2-9)    PHQ-2 Score: 1  Alcohol Screen: Not on file  Housing: Low Risk  (02/24/2024)   Received from [facility]   Epic    In the last 12 months, was there a time when you were not able to pay the mortgage or rent on time?: No    In the past 12 months, how many times have you moved where you were living?: 0    At any time in the past 12 months, were you homeless or living in a shelter (including now)?: No  Utilities: Not At Risk (02/24/2024)   Received from [facility]   Epic    In the past 12 months has the electric, gas, oil, or water company threatened to shut off services in your home?:  No  Health Literacy: Not on file    Family History  Problem Relation Age of Onset   Hypertension Mother    Breast cancer Neg Hx     Allergies[1]  Show/hide medication list[2]  Review of Systems  Constitutional: Negative.  Negative for chills, fever and malaise/fatigue.  HENT: Negative.  Negative for congestion and sore throat.   Eyes: Negative.  Negative for blurred vision and pain.  Respiratory: Negative.  Negative for cough and shortness of breath.   Cardiovascular: Negative.  Negative for chest pain, palpitations and leg swelling.  Gastrointestinal: Negative.  Negative for abdominal pain, blood in stool, constipation, diarrhea, heartburn, melena, nausea and vomiting.  Genitourinary: Negative.  Negative for dysuria, flank pain, frequency and urgency.  Musculoskeletal: Negative.  Negative for joint pain and myalgias.  Skin: Negative.   Neurological: Negative.  Negative for dizziness, tingling, sensory change, weakness and headaches.  Endo/Heme/Allergies: Negative.   Psychiatric/Behavioral: Negative.  Negative for depression and suicidal ideas. The patient is not nervous/anxious.        Objective:   BP (!) 152/88   Pulse 91   Ht 5' 6 (1.676 m)   Wt 165 lb 6.4 oz (75 kg)   SpO2 99%   BMI 26.70 kg/m   Vitals:   05/31/24 0931  BP: (!) 152/88  Pulse: 91  Height: 5' 6 (1.676 m)  Weight: 165 lb 6.4 oz (75 kg)  SpO2: 99%  BMI (Calculated): 26.71    Physical Exam Vitals and nursing note reviewed.  Constitutional:      Appearance: Normal appearance.  HENT:     Head: Normocephalic and atraumatic.     Nose: Nose normal.     Mouth/Throat:     Mouth: Mucous membranes are moist.     Pharynx: Oropharynx is clear.  Eyes:     Conjunctiva/sclera: Conjunctivae normal.     Pupils: Pupils are equal, round, and reactive to light.  Cardiovascular:     Rate and Rhythm: Normal rate and regular rhythm.     Pulses: Normal pulses.     Heart sounds: Normal heart sounds. No  murmur heard. Pulmonary:     Effort: Pulmonary effort is normal.     Breath sounds: Normal breath sounds. No wheezing.  Abdominal:     General: Bowel sounds are normal.     Palpations: Abdomen is soft.     Tenderness: There is no abdominal tenderness. There is no right CVA tenderness or left CVA tenderness.  Musculoskeletal:  General: Normal range of motion.     Cervical back: Normal range of motion.     Right lower leg: No edema.     Left lower leg: No edema.  Skin:    General: Skin is warm and dry.  Neurological:     General: No focal deficit present.     Mental Status: She is alert and oriented to person, place, and time.  Psychiatric:        Mood and Affect: Mood normal.        Behavior: Behavior normal.      Results for orders placed or performed in visit on 05/31/24  POCT CBG (Fasting - Glucose)  Result Value Ref Range   Glucose Fasting, POC 109 (A) 70 - 99 mg/dL    Recent Results (from the past 2160 hours)  Glucose, capillary     Status: Abnormal   Collection Time: 04/08/24  7:54 AM  Result Value Ref Range   Glucose-Capillary 111 (H) 70 - 99 mg/dL    Comment: Glucose reference range applies only to samples taken after fasting for at least 8 hours.  POCT CBG (Fasting - Glucose)     Status: Abnormal   Collection Time: 04/27/24 10:06 AM  Result Value Ref Range   Glucose Fasting, POC 108 (A) 70 - 99 mg/dL  [id number]     Status: None   Collection Time: 04/27/24 10:49 AM  Result Value Ref Range   TSH 2.160 0.450 - 4.500 uIU/mL   T3, Free 2.9 2.0 - 4.4 pg/mL   Free T4 1.10 0.82 - 1.77 ng/dL  Hemoglobin J8r     Status: Abnormal   Collection Time: 04/27/24 10:49 AM  Result Value Ref Range   Hgb A1c MFr Bld 7.1 (H) 4.8 - 5.6 %    Comment:          Prediabetes: 5.7 - 6.4          Diabetes: >6.4          Glycemic control for adults with diabetes: <7.0    Est. average glucose Bld gHb Est-mCnc 157 mg/dL  Lipid panel     Status: Abnormal   Collection Time:  04/27/24 10:49 AM  Result Value Ref Range   Cholesterol, Total 76 (L) 100 - 199 mg/dL   Triglycerides 79 0 - 149 mg/dL   HDL 26 (L) >60 mg/dL   VLDL Cholesterol Cal 17 5 - 40 mg/dL   LDL Chol Calc (NIH) 33 0 - 99 mg/dL   Chol/HDL Ratio 2.9 0.0 - 4.4 ratio    Comment:                                   T. Chol/HDL Ratio                                             Men  Women                               1/2 Avg.Risk  3.4    3.3                                   Avg.Risk  5.0    4.4                                2X Avg.Risk  9.6    7.1                                3X Avg.Risk 23.4   11.0   CBC with Diff     Status: Abnormal   Collection Time: 04/27/24 10:49 AM  Result Value Ref Range   WBC 8.9 3.4 - 10.8 x10E3/uL   RBC 5.11 3.77 - 5.28 x10E6/uL   Hemoglobin 13.3 11.1 - 15.9 g/dL   Hematocrit 57.5 65.9 - 46.6 %   MCV 83 79 - 97 fL   MCH 26.0 (L) 26.6 - 33.0 pg   MCHC 31.4 (L) 31.5 - 35.7 g/dL   RDW 85.3 88.2 - 84.5 %   Platelets 385 150 - 450 x10E3/uL   Neutrophils 61 Not Estab. %   Lymphs 25 Not Estab. %   Monocytes 7 Not Estab. %   Eos 5 Not Estab. %   Basos 2 Not Estab. %   Neutrophils Absolute 5.5 1.4 - 7.0 x10E3/uL   Lymphocytes Absolute 2.3 0.7 - 3.1 x10E3/uL   Monocytes Absolute 0.6 0.1 - 0.9 x10E3/uL   EOS (ABSOLUTE) 0.4 0.0 - 0.4 x10E3/uL   Basophils Absolute 0.1 0.0 - 0.2 x10E3/uL   Immature Granulocytes 0 Not Estab. %   Immature Grans (Abs) 0.0 0.0 - 0.1 x10E3/uL  CMP14+EGFR     Status: Abnormal   Collection Time: 04/27/24 10:49 AM  Result Value Ref Range   Glucose 121 (H) 70 - 99 mg/dL   BUN 9 8 - 27 mg/dL   Creatinine, Ser 9.44 (L) 0.57 - 1.00 mg/dL   eGFR 99 >40 fO/fpw/8.26   BUN/Creatinine Ratio 16 12 - 28   Sodium 144 134 - 144 mmol/L   Potassium 4.0 3.5 - 5.2 mmol/L   Chloride 104 96 - 106 mmol/L   CO2 26 20 - 29 mmol/L   Calcium 9.5 8.7 - 10.3 mg/dL   Total Protein 7.5 6.0 - 8.5 g/dL   Albumin 4.4 3.9 - 4.9 g/dL   Globulin, Total 3.1 1.5 - 4.5  g/dL   Bilirubin Total 0.4 0.0 - 1.2 mg/dL   Alkaline Phosphatase 55 49 - 135 IU/L   AST 30 0 - 40 IU/L   ALT 27 0 - 32 IU/L  Arthritis Panel     Status: Abnormal   Collection Time: 04/27/24 10:49 AM  Result Value Ref Range   Uric Acid 2.7 (L) 3.0 - 7.2 mg/dL    Comment:            Therapeutic target for gout patients: <6.0   Anti Nuclear Antibody (ANA) Negative Negative   Rheumatoid fact SerPl-aCnc <10.0 <14.0 IU/mL   Sed Rate 12 0 - 40 mm/hr  CK, total     Status: None   Collection Time: 04/27/24 10:49 AM  Result Value Ref Range   Total CK 99 32 - 182 U/L  POCT urinalysis dipstick     Status: Abnormal   Collection Time: 04/27/24 10:49 AM  Result Value Ref Range   Color, UA yellow    Clarity, UA clear    Glucose, UA Positive (A) Negative   Bilirubin, UA negative    Ketones, UA negative  Spec Grav, UA >=1.030 (A) 1.010 - 1.025   Blood, UA negative    pH, UA 6.0 5.0 - 8.0   Protein, UA Negative Negative   Urobilinogen, UA 0.2 0.2 or 1.0 E.U./dL   Nitrite, UA negative    Leukocytes, UA Negative Negative   Appearance     Odor    POC Influenza A/B     Status: Normal   Collection Time: 05/20/24 11:50 AM  Result Value Ref Range   Influenza A, POC Negative Negative   Influenza B, POC Negative Negative  POCT CBG (Fasting - Glucose)     Status: Abnormal   Collection Time: 05/31/24  9:35 AM  Result Value Ref Range   Glucose Fasting, POC 109 (A) 70 - 99 mg/dL      Assessment & Plan:  Contine current meds. Start Rybelsus  3 mg /d. Monitor BP at home. Problem List Items Addressed This Visit       Cardiovascular and Mediastinum   Hypertension associated with diabetes (HCC)     Endocrine   Type 2 diabetes mellitus with hyperglycemia, without long-term current use of insulin (HCC) - Primary   Relevant Orders   POCT CBG (Fasting - Glucose) (Completed)   Other specified hypothyroidism   Combined hyperlipidemia associated with type 2 diabetes mellitus (HCC)     Other    Idiopathic gout    Return in about 1 month (around 07/01/2024).   Total time spent: 30 minutes. This time includes review of previous notes and results and patient face to face interaction during today's visit.    FERNAND FREDY RAMAN, MD  05/31/2024   This document may have been prepared by [facility] Voice Recognition software and as such may include unintentional dictation errors.     [1]  Allergies Allergen Reactions   Ace Inhibitors   [2]  Outpatient Medications Prior to Visit  Medication Sig   allopurinol (ZYLOPRIM) 100 MG tablet TAKE 1 TABLET BY MOUTH TWICE A DAY   amLODipine (NORVASC) 10 MG tablet TAKE 1 TABLET BY MOUTH EVERY DAY   aspirin 81 MG chewable tablet Chew 81 mg by mouth daily.   FARXIGA 10 MG TABS tablet TAKE 1 TABLET BY MOUTH EVERY DAY   fluticasone  (FLONASE ) 50 MCG/ACT nasal spray Place 1 spray into both nostrils daily.   ipratropium (ATROVENT ) 0.06 % nasal spray Place 2 sprays into both nostrils 4 (four) times daily.   levothyroxine  (SYNTHROID ) 88 MCG tablet Take 1 tablet (88 mcg total) by mouth daily.   metFORMIN (GLUCOPHAGE) 500 MG tablet TAKE 1 TABLET BY MOUTH TWICE A DAY   metoprolol  succinate (TOPROL -XL) 25 MG 24 hr tablet TAKE 1 TABLET (25 MG TOTAL) BY MOUTH DAILY.   rosuvastatin (CRESTOR) 10 MG tablet TAKE 1 TABLET BY MOUTH EVERY DAY   benzonatate  (TESSALON ) 100 MG capsule Take 2 capsules (200 mg total) by mouth every 8 (eight) hours. (Patient not taking: Reported on 05/31/2024)   promethazine -dextromethorphan (PROMETHAZINE -DM) 6.25-15 MG/5ML syrup Take 5 mLs by mouth 4 (four) times daily as needed. (Patient not taking: Reported on 05/31/2024)   Semaglutide  (RYBELSUS ) 3 MG TABS Take 1 tablet (3 mg total) by mouth daily before breakfast. Take medication 30 minutes prior to eating or taking other medication (Patient not taking: Reported on 05/31/2024)   No facility-administered medications prior to visit.   "

## 2024-07-01 ENCOUNTER — Ambulatory Visit: Admitting: Internal Medicine

## 2024-07-26 ENCOUNTER — Ambulatory Visit: Admitting: Internal Medicine
